# Patient Record
Sex: Male | Born: 1965 | Race: Black or African American | Hispanic: No | Smoking: Current every day smoker
Health system: Southern US, Community
[De-identification: ages and names within clinical notes are randomized; demographics above are authoritative.]

## PROBLEM LIST (undated history)

## (undated) DIAGNOSIS — F32A Depression, unspecified: Secondary | ICD-10-CM

## (undated) DIAGNOSIS — I10 Essential (primary) hypertension: Secondary | ICD-10-CM

## (undated) DIAGNOSIS — K219 Gastro-esophageal reflux disease without esophagitis: Secondary | ICD-10-CM

## (undated) DIAGNOSIS — G4730 Sleep apnea, unspecified: Secondary | ICD-10-CM

## (undated) DIAGNOSIS — J189 Pneumonia, unspecified organism: Secondary | ICD-10-CM

## (undated) DIAGNOSIS — F329 Major depressive disorder, single episode, unspecified: Secondary | ICD-10-CM

## (undated) DIAGNOSIS — F419 Anxiety disorder, unspecified: Secondary | ICD-10-CM

## (undated) DIAGNOSIS — M1990 Unspecified osteoarthritis, unspecified site: Secondary | ICD-10-CM

## (undated) HISTORY — DX: Depression, unspecified: F32.A

## (undated) HISTORY — PX: TONSILLECTOMY: SUR1361

## (undated) HISTORY — DX: Anxiety disorder, unspecified: F41.9

## (undated) HISTORY — DX: Major depressive disorder, single episode, unspecified: F32.9

---

## 2018-05-17 ENCOUNTER — Emergency Department (HOSPITAL_COMMUNITY)
Admission: EM | Admit: 2018-05-17 | Discharge: 2018-05-17 | Disposition: A | Discharge status: Home / Self Care | Payer: 59 | Attending: Emergency Medicine | Admitting: Emergency Medicine

## 2018-05-17 ENCOUNTER — Emergency Department (HOSPITAL_COMMUNITY): Payer: 59

## 2018-05-17 ENCOUNTER — Encounter (HOSPITAL_COMMUNITY): Payer: Self-pay

## 2018-05-17 ENCOUNTER — Other Ambulatory Visit: Payer: Self-pay

## 2018-05-17 DIAGNOSIS — I10 Essential (primary) hypertension: Secondary | ICD-10-CM | POA: Diagnosis not present

## 2018-05-17 DIAGNOSIS — F17200 Nicotine dependence, unspecified, uncomplicated: Secondary | ICD-10-CM | POA: Insufficient documentation

## 2018-05-17 DIAGNOSIS — M5441 Lumbago with sciatica, right side: Secondary | ICD-10-CM | POA: Insufficient documentation

## 2018-05-17 DIAGNOSIS — M545 Low back pain: Secondary | ICD-10-CM | POA: Diagnosis present

## 2018-05-17 HISTORY — DX: Essential (primary) hypertension: I10

## 2018-05-17 HISTORY — DX: Unspecified osteoarthritis, unspecified site: M19.90

## 2018-05-17 MED ORDER — METHOCARBAMOL 500 MG PO TABS: 500.0000 mg | ORAL_TABLET | Freq: Two times a day (BID) | ORAL | 0 refills | Status: DC

## 2018-05-17 MED ORDER — LIDOCAINE 5 % EX PTCH: 1.0000 | MEDICATED_PATCH | CUTANEOUS | 0 refills | Status: DC

## 2018-05-17 MED ORDER — PREDNISONE 10 MG PO TABS: 40.0000 mg | ORAL_TABLET | Freq: Every day | ORAL | 0 refills | Status: AC

## 2018-05-17 MED ORDER — NAPROXEN 500 MG PO TABS: 500.0000 mg | ORAL_TABLET | Freq: Two times a day (BID) | ORAL | 0 refills | Status: DC

## 2018-05-17 MED ORDER — MORPHINE SULFATE (PF) 4 MG/ML IV SOLN
4.0000 mg | Freq: Once | INTRAVENOUS | Status: AC
  Administered 2018-05-17: 4 mg via INTRAMUSCULAR
  Filled 2018-05-17: qty 1

## 2018-05-17 NOTE — ED Provider Notes (Signed)
[facility]-EMERGENCY DEPT Provider Note   CSN: [id number] Arrival date & time: 05/17/18  4098     History   Chief Complaint Chief Complaint  Patient presents with  . Hip Pain  . Leg Pain    HPI Donald Hess is a 52 y.o. male with a history of hypertension, arthritis, who presents emergency department today for back pain and right leg pain.  Patient reports that he has a history of 2 herniated disks in his lumbar spine.  He was seen by a specialist out of town and has had multiple episodes of sciatica in the past.  He states that he had a flareup of similar symptoms with right-sided low back pain that radiates into his right hip, down the back of his leg and into his right foot.  He reports associated tingling.  He reports that this is worse with ambulation and hip flexion.  He denies any weakness.  He was seen at urgent care yesterday and received a shot of Toradol that did not relieve his pain.  He notes he has been taking ibuprofen without relief.  He reports he has taken steroids for his symptoms in the past with relief of his symptoms. He is concerned because the pain in his hip is more than his usual flairs and is requesting a xray. He denies trauma or falls. Denies history of cancer, trauma, fever, night pain, IV drug use, recent spinal manipulation or procedures, upper back pain or neck pain, urinary retention, loss of bowel/bladder function, saddle anesthesia, or unexplained weight loss. No abdominal pain. Denies dysuria, flank pain, frequency, urgency, or hematuria.   HPI  Past Medical History:  Diagnosis Date  . Arthritis   . Hypertension     There are no active problems to display for this patient.   Past Surgical History:  Procedure Laterality Date  . TONSILLECTOMY         Home Medications    Prior to Admission medications   Not on File    Family History No family history on file.  Social History Social History   Tobacco Use  .  Smoking status: Current Every Day Smoker    Packs/day: 0.50    Years: 35.00    Pack years: 17.50  . Smokeless tobacco: Never Used  Substance Use Topics  . Alcohol use: Yes  . Drug use: Yes    Types: Cocaine, Marijuana    Comment: "if i'm at a party"     Allergies   Lisinopril   Review of Systems Review of Systems  All other systems reviewed and are negative.    Physical Exam Updated Vital Signs BP (!) 143/91 (BP Location: Left Arm)   Pulse 83   Temp 98 F (36.7 C) (Oral)   Resp 15   Ht 5\' 11"  (1.803 m)   Wt 117 kg   SpO2 95%   BMI 35.98 kg/m   Physical Exam  Constitutional: He appears well-developed and well-nourished. No distress.  Non-toxic appearing  HENT:  Head: Normocephalic and atraumatic.  Right Ear: External ear normal.  Left Ear: External ear normal.  Neck: Normal range of motion. Neck supple. No spinous process tenderness present. No neck rigidity. Normal range of motion present.  Cardiovascular: Normal rate, regular rhythm, normal heart sounds and intact distal pulses.  No murmur heard. Pulses:      Radial pulses are 2+ on the right side, and 2+ on the left side.       Femoral pulses  are 2+ on the right side, and 2+ on the left side.      Dorsalis pedis pulses are 2+ on the right side, and 2+ on the left side.       Posterior tibial pulses are 2+ on the right side, and 2+ on the left side.  Pulmonary/Chest: Effort normal and breath sounds normal. No respiratory distress.  Abdominal: Soft. Bowel sounds are normal. He exhibits no distension and no pulsatile midline mass. There is no tenderness. There is no rigidity, no rebound, no guarding and no CVA tenderness.  Musculoskeletal:       Right hip: Normal. He exhibits normal range of motion, normal strength and no tenderness.  Posterior and appearance appears normal. No evidence of obvious scoliosis or kyphosis. No obvious signs of skin changes, trauma, deformity, infection. No C, T, or L spine tenderness  or step-offs to palpation. No C, T paraspinal tenderness. Right lumbar paraspinal ttp. Right gluteal ttp. No bony ttp of the hip. Negative log roll test. No leg shortening or external rotation. Normal rom of the hip. Pain in back and leg with minimal hip flexion. Lung expansion normal. Bilateral lower extremity strength 5 out of 5 including extensor hallucis longus. Patellar and Achilles deep tendon reflex 2+ and equal bilaterally. Sensation of lower extremities grossly intact. Straight leg right postive. Straight leg left negative. Gait able but patient notes painful. Lower extremity compartments soft. PT and DP 2+ b/l. Cap refill <2 seconds.   Neurological: He is alert. He has normal strength. No sensory deficit.  No foot drop. Able gait  Skin: Skin is warm, dry and intact. Capillary refill takes less than 2 seconds. No rash noted. He is not diaphoretic. No erythema.  No vesicular-like rash noted.  Nursing note and vitals reviewed.    ED Treatments / Results  Labs (all labs ordered are listed, but only abnormal results are displayed) Labs Reviewed - No data to display  EKG None  Radiology Dg Hip Unilat With Pelvis 2-3 Views Right  Result Date: 05/17/2018 CLINICAL DATA:  Pain right hip and leg. EXAM: DG HIP (WITH OR WITHOUT PELVIS) 2-3V RIGHT COMPARISON:  No prior. FINDINGS: Degenerative changes lumbar spine and both hips. No acute bony or joint abnormality identified. No evidence of fracture dislocation. Pelvic calcifications consistent with phleboliths. IMPRESSION: Degenerative changes lumbar spine and both hips. No acute abnormality identified. Electronically Signed   By: Maisie Fushomas  Register   On: 05/17/2018 07:31    Procedures Procedures (including critical care time)  Medications Ordered in ED Medications  morphine 4 MG/ML injection 4 mg (has no administration in time range)     Initial Impression / Assessment and Plan / ED Course  I have reviewed the triage vital signs and the  nursing notes.  Pertinent labs & imaging results that were available during my care of the patient were reviewed by me and considered in my medical decision making (see chart for details).     52 y.o. year old male with back pain.  No neurologic deficits and normal neuro exam.  Patient can walk but states it is painful.  No bowel or bladder incontinence.  No urinary retention or saddle anesthesia.  No concern for cauda equina.  No history of trauma, personal history of cancer, night sweats or weight loss that would warrant a x-ray of the lumbar spine at this time.  No  fever or IV drug use that make me concerned for spinal abscess.  No pulsatile mass of the  abdomen.  No abdominal tenderness to palpation or guarding to make me concern for intra-abdominal pathology.  No urinary symptoms or CVA tenderness to make me concerned for cystitis versus pyelonephritis versus kidney stone. Patient with some right hip pina. Xrays unremarkable. Suspect patient's symptoms are musculoskeletal in nature.  evidence of sciatica with  straight leg raise.  Will treat the patient with back exercises, activity modification, muscle relaxers, NSAIDs (no history of kidney disease or GI bleed), and lidocaine patches. Will also give steroids for sciatica (no hx of dm reported).  Patient is to follow-up with PCP versus orthopedics (referral given).  Strict return precautions discussed.  Patient appears safe for discharge.  Final Clinical Impressions(s) / ED Diagnoses   Final diagnoses:  Acute right-sided low back pain with right-sided sciatica    ED Discharge Orders         Ordered    methocarbamol (ROBAXIN) 500 MG tablet  2 times daily     05/17/18 0819    naproxen (NAPROSYN) 500 MG tablet  2 times daily     05/17/18 0819    predniSONE (DELTASONE) 10 MG tablet  Daily with breakfast     05/17/18 0819    lidocaine (LIDODERM) 5 %  Every 24 hours     05/17/18 0819           Jacinto Halim, PA-C 05/17/18 0909      Molpus, Jonny Ruiz, MD 05/20/18 [phone]

## 2018-05-17 NOTE — Discharge Instructions (Addendum)
You were seen here today for Back Pain: Low back pain is discomfort in the lower back that may be due to injuries to muscles and ligaments around the spine. Occasionally, it may be caused by a problem to a part of the spine called a disc. Your back pain should be treated with medicines listed below as well as back exercises and this back pain should get better over the next 2 weeks. Most patients get completely well in 4 weeks. It is important to know however, if you develop severe or worsening pain, low back pain with fever, numbness, weakness or inability to walk or urinate, you should return to the ER immediately.  Please follow up with your doctor this week for a recheck if still having symptoms.  HOME INSTRUCTIONS Self - care:  The application of heat can help soothe the pain.  Maintaining your daily activities, including walking (this is encouraged), as it will help you get better faster than just staying in bed. Do not life, push, pull anything more than 10 pounds for the next week. I am attaching back exercises that you can do at home to help facilitate your recovery.   Back Exercises - I have attached a handout on back exercises that can be done at home to help facilitate your recovery.   Medications are also useful to help with pain control.   Acetaminophen.  This medication is generally safe, and found over the counter. Take as directed for your age. You should not take more than 8 of the extra strength (500mg ) pills a day (max dose is 4000mg  total OVER one day)  Non steroidal anti inflammatory: This includes medications including Ibuprofen, naproxen and Mobic; These medications help both pain and swelling and are very useful in treating back pain.  They should be taken with food, as they can cause stomach upset, and more seriously, stomach bleeding. Do not combine the medications.   Muscle relaxants:  These medications can help with muscle tightness that is a cause of lower back pain.  Most  of these medications can cause drowsiness, and it is not safe to drive or use dangerous machinery while taking them. They are primarily helpful when taken at night before sleep.  Prednisone - Prednisone can be used for low back pain when nerves such as the sciatic nerve are though to be involved. This medication will help decrease the inflammation around the nerve and help facilitate faster recovery. Call your pharmacist if you have any questions.  You will need to follow up with your primary healthcare provider or the Orthopedist in 1-2 weeks for reassessment and persistent symptoms.  Be aware that if you develop new symptoms, such as a fever, leg weakness, difficulty with or loss of control of your urine or bowels, abdominal pain, or more severe pain, you will need to seek medical attention and/or return to the Emergency department.  Additional Information:  Your xrays did not show evidence of broken bones or dislocations.   Your vital signs today were: BP (!) 143/91 (BP Location: Left Arm)    Pulse 83    Temp 98 F (36.7 C) (Oral)    Resp 15    Ht 5\' 11"  (1.803 m)    Wt 117 kg    SpO2 95%    BMI 35.98 kg/m  If your blood pressure (BP) was elevated above 135/85 this visit, please have this repeated by your doctor within one month. ---------------

## 2018-05-17 NOTE — ED Triage Notes (Signed)
Per ems: pt coming from home c/o pain in hips and bilateral legs that started two days ago. Went to Urgent Care yesterday for same thing, received pain medication and stated "pain is back"  Hx of two herniated spinal disk and HTN

## 2018-05-17 NOTE — ED Notes (Signed)
Bed: [id number] Expected date:  Expected time:  Means of arrival:  Comments: Hip and leg pain

## 2018-05-24 ENCOUNTER — Other Ambulatory Visit: Payer: Self-pay

## 2018-05-24 ENCOUNTER — Encounter (HOSPITAL_COMMUNITY): Payer: Self-pay | Admitting: *Deleted

## 2018-05-24 ENCOUNTER — Emergency Department (HOSPITAL_COMMUNITY)
Admission: EM | Admit: 2018-05-24 | Discharge: 2018-05-24 | Disposition: A | Discharge status: Home / Self Care | Payer: 59 | Attending: Emergency Medicine | Admitting: Emergency Medicine

## 2018-05-24 DIAGNOSIS — G8929 Other chronic pain: Secondary | ICD-10-CM | POA: Insufficient documentation

## 2018-05-24 DIAGNOSIS — I10 Essential (primary) hypertension: Secondary | ICD-10-CM | POA: Insufficient documentation

## 2018-05-24 DIAGNOSIS — Z79899 Other long term (current) drug therapy: Secondary | ICD-10-CM | POA: Insufficient documentation

## 2018-05-24 DIAGNOSIS — F17200 Nicotine dependence, unspecified, uncomplicated: Secondary | ICD-10-CM | POA: Diagnosis not present

## 2018-05-24 DIAGNOSIS — M5441 Lumbago with sciatica, right side: Secondary | ICD-10-CM | POA: Diagnosis not present

## 2018-05-24 MED ORDER — KETOROLAC TROMETHAMINE 30 MG/ML IJ SOLN
30.0000 mg | Freq: Once | INTRAMUSCULAR | Status: AC
  Administered 2018-05-24: 30 mg via INTRAMUSCULAR
  Filled 2018-05-24: qty 1

## 2018-05-24 MED ORDER — CYCLOBENZAPRINE HCL 10 MG PO TABS: 10.0000 mg | ORAL_TABLET | Freq: Two times a day (BID) | ORAL | 0 refills | Status: DC | PRN

## 2018-05-24 NOTE — ED Provider Notes (Signed)
[facility]   CSN: [id number] Arrival date & time: 05/24/18  0510     History   Chief Complaint Chief Complaint  Patient presents with  . Back Pain    HPI Donald Hess is a 52 y.o. male.  HPI  52 yo male with history of herniated disc with associated sciatica for several years.  Moved here from [place] 3 months ago.  Pain worsened during the same time frame.  Pain is severe.  Pain was moderate.  Leg is numb and tingling- has been present but seems worse than hip.  NO new injury.  No loss of bowel or bladder control.  Feels like something is gouging the bone out and is severe.  Not relieved by position.  Worsens with movement.  Patient treated with naprosyn, steroids, and smr without relief.   Past Medical History:  Diagnosis Date  . Arthritis   . Hypertension     There are no active problems to display for this patient.   Past Surgical History:  Procedure Laterality Date  . TONSILLECTOMY          Home Medications    Prior to Admission medications   Medication Sig Start Date End Date Taking? Authorizing Provider  amLODipine (NORVASC) 10 MG tablet Take 10 mg by mouth daily.   Yes [provider]  atorvastatin (LIPITOR) 40 MG tablet Take 40 mg by mouth at bedtime.   Yes [provider]  cloNIDine (CATAPRES) 0.1 MG tablet Take 0.1 mg by mouth 2 (two) times daily.   Yes [provider]  divalproex (DEPAKOTE) 500 MG DR tablet Take 500-1,000 mg by mouth 2 (two) times daily. 1 tablet every morning and 2 tablets every night at bedtime   Yes [provider]  hydrochlorothiazide (HYDRODIURIL) 25 MG tablet Take 25 mg by mouth daily.   Yes [provider]  meloxicam (MOBIC) 15 MG tablet Take 15 mg by mouth daily.   Yes [provider]  mirtazapine (REMERON) 15 MG tablet Take 15 mg by mouth at bedtime.   Yes [provider]  naproxen (NAPROSYN) 500 MG tablet Take 1  tablet (500 mg total) by mouth 2 (two) times daily. 05/17/18  Yes Maczis, Elmer SowMichael M, PA-C  omeprazole (PRILOSEC) 20 MG capsule Take 20 mg by mouth daily.   Yes [provider]  QUEtiapine (SEROQUEL) 100 MG tablet Take 100 mg by mouth at bedtime.   Yes [provider]  tamsulosin (FLOMAX) 0.4 MG CAPS capsule Take 0.4 mg by mouth daily.   Yes [provider]  traZODone (DESYREL) 50 MG tablet Take 25-50 mg by mouth See admin instructions. Take 1/2 tablet at 7pm and take 1 tablet at bedtime   Yes [provider]  lidocaine (LIDODERM) 5 % Place 1 patch onto the skin daily. Remove & Discard patch within 12 hours or as directed by MD 05/17/18   Maczis, Elmer SowMichael M, PA-C  methocarbamol (ROBAXIN) 500 MG tablet Take 1 tablet (500 mg total) by mouth 2 (two) times daily. Patient not taking: Reported on 05/24/2018 05/17/18   Jacinto HalimMaczis, Michael M, PA-C    Family History No family history on file.  Social History Social History   Tobacco Use  . Smoking status: Current Every Day Smoker    Packs/day: 0.50    Years: 35.00    Pack years: 17.50  . Smokeless tobacco: Never Used  Substance Use Topics  . Alcohol use: Yes  . Drug use: Yes  Types: Cocaine, Marijuana    Comment: "if i'm at a party"     Allergies   Lisinopril   Review of Systems Review of Systems  All other systems reviewed and are negative.    Physical Exam Updated Vital Signs BP (!) 141/98   Pulse 85   Temp 97.9 F (36.6 C) (Oral)   Resp 18   SpO2 95%   Physical Exam  Constitutional: He is oriented to person, place, and time. He appears well-developed and well-nourished.  HENT:  Head: Normocephalic and atraumatic.  Eyes: Pupils are equal, round, and reactive to light. EOM are normal.  Neck: Normal range of motion.  Cardiovascular: Normal rate and regular rhythm.  Pulmonary/Chest: Effort normal.  Abdominal: Soft.  Musculoskeletal: Normal range of motion. He exhibits tenderness. He exhibits  no edema.  Neurological: He is alert and oriented to person, place, and time. He displays normal reflexes. No cranial nerve deficit or sensory deficit. He exhibits normal muscle tone. Coordination normal.  Skin: Skin is warm and dry. Capillary refill takes less than 2 seconds.  Psychiatric: He has a normal mood and affect.  Nursing note and vitals reviewed.    ED Treatments / Results  Labs (all labs ordered are listed, but only abnormal results are displayed) Labs Reviewed - No data to display  EKG None  Radiology No results found.  Procedures Procedures (including critical care time)  Medications Ordered in ED Medications - No data to display   Initial Impression / Assessment and Plan / ED Course  I have reviewed the triage vital signs and the nursing notes.  Pertinent labs & imaging results that were available during my care of the patient were reviewed by me and considered in my medical decision making (see chart for details).     52 year old male history of chronic low back pain with sciatica presents today after having, reportedly, moved to this area from [place].  He has been seen here and treated with skeletal muscle relaxants, nonsteroidal anti-inflammatories, and prescribed lidocaine patch, which he does not appear to be filled.  He is states that the pain is continuing and worse in nature.  He had previous referrals and has scheduled visits in place.  Here he has no apparent change on exam without any acute neurological deficits.  Patient was given Toradol IM here.  He is advised to use ibuprofen and Flexeril as outpatient.  He is also advised to fill his previous lidocaine patch and follow-up with outpatient referrals as previously scheduled.  Final Clinical Impressions(s) / ED Diagnoses   Final diagnoses:  Chronic midline low back pain with right-sided sciatica    ED Discharge Orders    None       Margarita Grizzle, MD 05/24/18 [phone]

## 2018-05-24 NOTE — ED Notes (Signed)
Bed: [facility]PT1 Expected date:  Expected time:  Means of arrival:  Comments: 

## 2018-05-24 NOTE — Discharge Instructions (Signed)
Please fill previous lidocaine patch prescription.   Use ibuprofen Use flexeril as prescribed. Follow up with outpatient medical care as scheduled

## 2018-05-24 NOTE — ED Triage Notes (Signed)
Pt c/o pain in the right hip/right back and right leg. Pt was seen here for the same on the 23rd, has taken course of steroids, using pain meds and muscle relaxants without relief. He last took naproxyn about 1 hour ago. Says he started having some numbness and tingling in the right leg after he started taking the medication. No bowel/bladder incontinence. Has appt with orthopedist on the 10th.

## 2018-05-28 ENCOUNTER — Ambulatory Visit (HOSPITAL_COMMUNITY): Payer: Self-pay | Admitting: Psychiatry

## 2018-06-04 ENCOUNTER — Encounter (INDEPENDENT_AMBULATORY_CARE_PROVIDER_SITE_OTHER): Payer: Self-pay | Admitting: Orthopaedic Surgery

## 2018-06-04 ENCOUNTER — Ambulatory Visit (INDEPENDENT_AMBULATORY_CARE_PROVIDER_SITE_OTHER): Payer: 59

## 2018-06-04 ENCOUNTER — Ambulatory Visit (INDEPENDENT_AMBULATORY_CARE_PROVIDER_SITE_OTHER): Payer: 59 | Admitting: Orthopaedic Surgery

## 2018-06-04 ENCOUNTER — Other Ambulatory Visit (INDEPENDENT_AMBULATORY_CARE_PROVIDER_SITE_OTHER): Payer: Self-pay

## 2018-06-04 DIAGNOSIS — G8929 Other chronic pain: Secondary | ICD-10-CM | POA: Diagnosis not present

## 2018-06-04 DIAGNOSIS — M5442 Lumbago with sciatica, left side: Secondary | ICD-10-CM | POA: Diagnosis not present

## 2018-06-04 DIAGNOSIS — M4807 Spinal stenosis, lumbosacral region: Secondary | ICD-10-CM

## 2018-06-04 MED ORDER — TRAMADOL HCL 50 MG PO TABS: 50.0000 mg | ORAL_TABLET | Freq: Two times a day (BID) | ORAL | 0 refills | Status: DC | PRN

## 2018-06-04 MED ORDER — TIZANIDINE HCL 4 MG PO TABS: 4.0000 mg | ORAL_TABLET | Freq: Three times a day (TID) | ORAL | 0 refills | Status: DC | PRN

## 2018-06-04 NOTE — Progress Notes (Signed)
Office Visit Note   Patient: Donald Hess           Date of Birth: 05/03/1966           MRN: [id number] Visit Date: 06/04/2018              Requested by: No referring provider defined for this encounter. PCP: Patient, No Pcp Per   Assessment & Plan: Visit Diagnoses:  1. Chronic left-sided low back pain with left-sided sciatica     Plan: At this point given his reported history of lumbar spine issues with a herniated disc and in fact that he is been to the emergency room is now here twice due to severe pain, and MRI is warranted of his lumbar spine to rule out herniated disc so we can get a better idea of what is the driving source of his pain so then we can make appropriate treatment recommendations.  For now we will only do a short course of Zanaflex and tramadol but only a short course.  We will see him back once we have an MRI of his lumbar spine.  Follow-Up Instructions: Return in about 2 weeks (around 06/18/2018).   Orders:  Orders Placed This Encounter  Procedures  . XR Lumbar Spine 2-3 Views   Meds ordered this encounter  Medications  . tiZANidine (ZANAFLEX) 4 MG tablet    Sig: Take 1 tablet (4 mg total) by mouth every 8 (eight) hours as needed for muscle spasms.    Dispense:  40 tablet    Refill:  0  . traMADol (ULTRAM) 50 MG tablet    Sig: Take 1-2 tablets (50-100 mg total) by mouth every 12 (twelve) hours as needed.    Dispense:  20 tablet    Refill:  0      Procedures: No procedures performed   Clinical Data: No additional findings.   Subjective: Chief Complaint  Patient presents with  . Lower Back - Pain  The patient somewhat I am seeing for the first time as a referral from the emergency room.  He is someone from [place] who was recently moved down [place], last several months.  He is been to the emergency room twice now with severe back pain and sciatica.  He states that in [place] he was told he needed surgery due to herniated disc.  He reports  multiple epidural steroid injections when he was in [place].  He says he is unable to get his records and not sure how to do that.  He is down here now in [place] and having problems sleeping due to worsening pain and numbness and tingling in his feet.  Sometimes the left side is worse sometimes the right side is worse.  Denies any groin pain.  He is not a diabetic.  He is a smoker.  Again his biggest problem he states is ambulating and not getting any sleep at night now due to the severity of his pain.  He denies any change in bowel bladder function.  HPI  Review of Systems He currently denies any headache, chest pain, shortness of breath, fever, chills, nausea, vomiting.  Objective: Vital Signs: There were no vitals taken for this visit.  Physical Exam He is alert and oriented x3 and in no acute distress.  Is ambulate very slowly. Ortho Exam Examination of both his lower extremities shows decreased sensation more on the right than the left.  He seems to exhibit weakness with plantar flexion on the right foot  compared to the left but some of this lack of effort ability.  He seems to have a positive straight leg raise bilaterally and significant amount of pain out of portion of exam. Specialty Comments:  No specialty comments available.  Imaging: Xr Lumbar Spine 2-3 Views  Result Date: 06/04/2018 2 views lumbar spine show slight degenerative changes in the lower lumbar spine but no loss in alignment or acute findings.  There is no evidence of fracture.    PMFS History: There are no active problems to display for this patient.  Past Medical History:  Diagnosis Date  . Arthritis   . Hypertension     History reviewed. No pertinent family history.  Past Surgical History:  Procedure Laterality Date  . TONSILLECTOMY     Social History   Occupational History  . Not on file  Tobacco Use  . Smoking status: Current Every Day Smoker    Packs/day: 0.50    Years: 35.00    Pack  years: 17.50  . Smokeless tobacco: Never Used  Substance and Sexual Activity  . Alcohol use: Yes  . Drug use: Yes    Types: Cocaine, Marijuana    Comment: "if i'm at a party"  . Sexual activity: Not on file

## 2018-06-13 ENCOUNTER — Ambulatory Visit
Admission: RE | Admit: 2018-06-13 | Discharge: 2018-06-13 | Disposition: A | Discharge status: Home / Self Care | Payer: 59 | Source: Ambulatory Visit | Attending: Orthopaedic Surgery | Admitting: Orthopaedic Surgery

## 2018-06-13 DIAGNOSIS — M4807 Spinal stenosis, lumbosacral region: Secondary | ICD-10-CM

## 2018-06-18 ENCOUNTER — Ambulatory Visit (INDEPENDENT_AMBULATORY_CARE_PROVIDER_SITE_OTHER): Payer: 59 | Admitting: Orthopaedic Surgery

## 2018-06-18 ENCOUNTER — Encounter (INDEPENDENT_AMBULATORY_CARE_PROVIDER_SITE_OTHER): Payer: Self-pay | Admitting: Orthopaedic Surgery

## 2018-06-18 DIAGNOSIS — M4807 Spinal stenosis, lumbosacral region: Secondary | ICD-10-CM

## 2018-06-18 DIAGNOSIS — M5126 Other intervertebral disc displacement, lumbar region: Secondary | ICD-10-CM | POA: Diagnosis not present

## 2018-06-18 DIAGNOSIS — M5431 Sciatica, right side: Secondary | ICD-10-CM | POA: Diagnosis not present

## 2018-06-18 MED ORDER — HYDROCODONE-ACETAMINOPHEN 5-325 MG PO TABS: 1.0000 | ORAL_TABLET | Freq: Four times a day (QID) | ORAL | 0 refills | Status: DC | PRN

## 2018-06-18 NOTE — Progress Notes (Signed)
The patient is coming in today to go over an MRI of his lumbar spine.  He said right-sided sciatic and radicular symptoms with pain going all the way down his foot as well as weakness on that side.  He says positive straight leg raise as well.  There is numbness and tingling into his foot.  [place] had recently [place] state.  He reports that he has had numerous interventions in his spine in terms of epidural steroid injections but no surgery.  He said he was told that he would likely at some point need surgery on his back due to herniated disc.  I sent her for an MRI because we had to at least get a new MRI to get an idea of what is going on the spine.  He still having the same radicular symptoms on exam today.  MRI is reviewed with him and it does show a right-sided posterior lateral disc herniation at L4-L5 with a large disc fragment behind L4 nerve root and this is most likely compressing the nerve root and correlating with his symptoms on exam.  Given the extent of his disc herniation at L4-L5 to the right and how this is correlating with symptoms and given the fact that he is already failed conservative treatment with epidural steroid injections in time, we need to send him to 1 of our spine specialist to consider surgical intervention.  He is interested in talking with the spine specialist so we will set him up to see Dr. Ophelia CharterYates soon.  I will send in a short course of hydrocodone in the interim.

## 2018-06-21 ENCOUNTER — Encounter (INDEPENDENT_AMBULATORY_CARE_PROVIDER_SITE_OTHER): Payer: Self-pay | Admitting: Orthopaedic Surgery

## 2018-06-21 ENCOUNTER — Ambulatory Visit (INDEPENDENT_AMBULATORY_CARE_PROVIDER_SITE_OTHER): Payer: 59 | Admitting: Orthopaedic Surgery

## 2018-06-21 ENCOUNTER — Telehealth: Payer: Self-pay | Admitting: *Deleted

## 2018-06-21 VITALS — BP 144/92 | HR 110 | Ht 72.0 in | Wt 255.0 lb

## 2018-06-21 DIAGNOSIS — M5126 Other intervertebral disc displacement, lumbar region: Secondary | ICD-10-CM | POA: Diagnosis not present

## 2018-06-21 DIAGNOSIS — M5431 Sciatica, right side: Secondary | ICD-10-CM

## 2018-06-21 NOTE — Progress Notes (Signed)
Office Visit Note   Patient: Donald Hess           Date of Birth: 18-Jan-1966           MRN: [id number] Visit Date: 06/21/2018              Requested by: No referring provider defined for this encounter. PCP: Patient, No Pcp Per   Assessment & Plan: Visit Diagnoses:  1. Lumbar herniated disc   2. Sciatica, right side     Plan: With patient's progressively worsening symptoms along with right leg weakness and findings on MRI best course of treatment at this point would be right L4-5 microdiscectomy.  Surgery procedure along with potential risks and comp occasions discussed with patient in great detail by myself and Dr. Ophelia Charter.  All questions answered.  He states that he has some courses upcoming the next couple weekends but would like to have surgery the week of October 8.  I reviewed patient's medication and past medical history.  He states that he has seen a cardiologist a couple years ago while in [place] and had a stress test and at that time was advised that he had changes consistent with an MI.  We need to get him established with a primary care physician and also evaluated by cardiology before his surgery.  We will assist with this.  All questions answered.  Follow-Up Instructions: Return for need return office visit 1 week postop.   Orders:  No orders of the defined types were placed in this encounter.  No orders of the defined types were placed in this encounter.     Procedures: No procedures performed   Clinical Data: No additional findings.   Subjective: Chief Complaint  Patient presents with  . Lower Back - Pain    HPI 52 year old black male is referred by Dr. Doneen Hess for worsening low back pain and right lower extremity radiculopathy.  Patient states that he has had problems with his low back for several years but this is been progressively getting worse over the last 3 months.  No new injury.  States that he has pain in the low back that  radiates down to the right buttock and down to his right foot.  Also complains of right leg weakness and numbness and tingling.  No symptoms on the left side.  He has been seen in the emergency room and treated with injections and muscle relaxers.  Dr. Magnus Hess ordered a lumbar MRI and that showed L4-5 disc degeneration with right posterior lateral disc herniation with a 1 cm disc fragment migrated upward in the right lateral recess behind L4.  This would likely compress the right L4 nerve.  There is a bilateral facet also arthritis at this level which could contribute to back pain.  L5-S1 normal appearance of the disc.  Facet osteoarthritis right worse than left that could cause low back pain.  Last of years in [place] he has had conservative management with lumbar ESI's that did not give any relief.  He denies any complaints of bowel or bladder incontinence.  Patient just recently moved here several months ago and is not established with a primary care physician.  He states that he has seen a cardiologist while living in [place] a couple years ago and had a stress test and was told that he had changes consistent with a possible MI.  We do not have any of those records were reviewed. Review of Systems No current cardiac pulmonary GI GU  issues  Objective: Vital Signs: BP (!) 144/92   Pulse (!) 110   Ht 6' (1.829 m)   Wt 255 lb (115.7 kg)   BMI 34.58 kg/m   Physical Exam  Constitutional: He is oriented to person, place, and time. No distress.  HENT:  Head: Normocephalic and atraumatic.  Eyes: Pupils are equal, round, and reactive to light. EOM are normal.  Neck: Normal range of motion.  Cardiovascular: Normal rate.  Pulmonary/Chest: Breath sounds normal. No respiratory distress.  Abdominal: He exhibits no distension.  Musculoskeletal:  Gait antalgic.  Lumbar paraspinal tenderness.  Negative log about his.  Positive right straight leg raise.  Bilateral calves nontender.  Trace right anterior  tib and gastroc weakness.  Neurological: He is alert and oriented to person, place, and time.  Skin: Skin is warm and dry.  Psychiatric: He has a normal mood and affect.    Ortho Exam  Specialty Comments:  No specialty comments available.  Imaging: No results found.   PMFS History: Patient Active Problem List   Diagnosis Date Noted  . Sciatica, right side 06/18/2018  . Lumbar herniated disc 06/18/2018   Past Medical History:  Diagnosis Date  . Arthritis   . Hypertension     History reviewed. No pertinent family history.  Past Surgical History:  Procedure Laterality Date  . TONSILLECTOMY     Social History   Occupational History  . Not on file  Tobacco Use  . Smoking status: Current Every Day Smoker    Packs/day: 0.50    Years: 35.00    Pack years: 17.50  . Smokeless tobacco: Never Used  Substance and Sexual Activity  . Alcohol use: Yes  . Drug use: Yes    Types: Cocaine, Marijuana    Comment: "if i'm at a party"  . Sexual activity: Not on file

## 2018-06-21 NOTE — Telephone Encounter (Signed)
   [facility] Pre-operative Risk Assessment    Request for surgical clearance:  1. What type of surgery is being performed? L 4-5 Microdiscectomy   2. When is this surgery scheduled? TBD pending clearance  3. What type of clearance is required (medical clearance vs. Pharmacy clearance to hold med vs. Both)? BOTH  4. Are there any medications that need to be held prior to surgery and how long? Pending Provider Advisory  5. Practice name and name of physician performing surgery? [facility], Rodell Perna, MD  6. What is your office phone number [phone]   7.   What is your office fax number [phone]   8.   Anesthesia type (None, local, MAC, general)? TBD pending clearance   Donald Hess 06/21/2018, 4:26 PM  _________________________________________________________________   (provider comments below)

## 2018-06-24 ENCOUNTER — Telehealth (HOSPITAL_COMMUNITY): Payer: Self-pay

## 2018-06-24 ENCOUNTER — Ambulatory Visit (INDEPENDENT_AMBULATORY_CARE_PROVIDER_SITE_OTHER): Payer: 59 | Admitting: Cardiology

## 2018-06-24 ENCOUNTER — Encounter: Payer: Self-pay | Admitting: Cardiology

## 2018-06-24 VITALS — BP 110/72 | HR 84 | Ht 72.0 in | Wt 257.0 lb

## 2018-06-24 DIAGNOSIS — F17210 Nicotine dependence, cigarettes, uncomplicated: Secondary | ICD-10-CM

## 2018-06-24 DIAGNOSIS — E782 Mixed hyperlipidemia: Secondary | ICD-10-CM

## 2018-06-24 DIAGNOSIS — I10 Essential (primary) hypertension: Secondary | ICD-10-CM | POA: Diagnosis not present

## 2018-06-24 DIAGNOSIS — Z01810 Encounter for preprocedural cardiovascular examination: Secondary | ICD-10-CM | POA: Insufficient documentation

## 2018-06-24 NOTE — Telephone Encounter (Signed)
The patient was seen by Revankar, Aundra Dubin, MD today for surgical clearance. Pending testing. Will provide clearance once test resulted.   Manson Passey, PAC

## 2018-06-24 NOTE — Patient Instructions (Signed)
Medication Instructions:  Your physician recommends that you continue on your current medications as directed. Please refer to the Current Medication list given to you today.  Labwork: Your physician recommends that you have the following labs drawn: BMP and magnesium  Testing/Procedures: Your physician has requested that you have an echocardiogram. Echocardiography is a painless test that uses sound waves to create images of your heart. It provides your doctor with information about the size and shape of your heart and how well your heart's chambers and valves are working. This procedure takes approximately one hour. There are no restrictions for this procedure.  Your physician has requested that you have a lexiscan myoview. For further information please visit [email or web address] Please follow instruction sheet, as given.  Follow-Up: Your physician recommends that you schedule a follow-up appointment in: as needed  Any Other Special Instructions Will Be Listed Below (If Applicable).     If you need a refill on your cardiac medications before your next appointment, please call your pharmacy.   [facility]  Garey Ham, RN, BSN

## 2018-06-24 NOTE — Progress Notes (Deleted)
Cardiology Office Note:    Date:  06/24/2018   ID:  Donald Hess, DOB 09/10/1966, MRN [id number]  PCP:  Patient, No Pcp Per  Cardiologist:  Garwin Brothers, MD   Referring MD: Eldred Manges, MD    ASSESSMENT:    1. Pre-operative cardiovascular examination   2. Essential hypertension   3. Mixed dyslipidemia   4. Cigarette smoker    PLAN:    In order of problems listed above:  1. ***   Medication Adjustments/Labs and Tests Ordered: Current medicines are reviewed at length with the patient today.  Concerns regarding medicines are outlined above.  No orders of the defined types were placed in this encounter.  No orders of the defined types were placed in this encounter.    History of Present Illness:    Donald Hess is a 52 y.o. male who is being seen today for the evaluation of *** at the request of Eldred Manges, MD. ***  Past Medical History:  Diagnosis Date  . Arthritis   . Hypertension     Past Surgical History:  Procedure Laterality Date  . TONSILLECTOMY      Current Medications: Current Meds  Medication Sig  . amLODipine (NORVASC) 10 MG tablet Take 10 mg by mouth daily.  . cloNIDine (CATAPRES) 0.1 MG tablet Take 0.1 mg by mouth 2 (two) times daily.  . cyclobenzaprine (FLEXERIL) 10 MG tablet Take 1 tablet (10 mg total) by mouth 2 (two) times daily as needed for muscle spasms.  . divalproex (DEPAKOTE) 500 MG DR tablet Take 500-1,000 mg by mouth 2 (two) times daily. 1 tablet every morning and 2 tablets every night at bedtime  . hydrochlorothiazide (HYDRODIURIL) 25 MG tablet Take 25 mg by mouth daily.  Marland Kitchen HYDROcodone-acetaminophen (NORCO/VICODIN) 5-325 MG tablet Take 1-2 tablets by mouth every 6 (six) hours as needed for moderate pain.  . mirtazapine (REMERON) 15 MG tablet Take 15 mg by mouth at bedtime.  Marland Kitchen QUEtiapine (SEROQUEL) 100 MG tablet Take 100 mg by mouth at bedtime.  . traZODone (DESYREL) 50 MG tablet Take 25-50 mg by mouth See admin  instructions. Take 1/2 tablet at 7pm and take 1 tablet at bedtime     Allergies:   Lisinopril   Social History   Socioeconomic History  . Marital status: Unknown    Spouse name: Not on file  . Number of children: Not on file  . Years of education: Not on file  . Highest education level: Not on file  Occupational History  . Not on file  Social Needs  . Financial resource strain: Not on file  . Food insecurity:    Worry: Not on file    Inability: Not on file  . Transportation needs:    Medical: Not on file    Non-medical: Not on file  Tobacco Use  . Smoking status: Current Every Day Smoker    Packs/day: 0.50    Years: 35.00    Pack years: 17.50  . Smokeless tobacco: Never Used  Substance and Sexual Activity  . Alcohol use: Yes  . Drug use: Yes    Types: Cocaine, Marijuana    Comment: "if i'm at a party"  . Sexual activity: Not on file  Lifestyle  . Physical activity:    Days per week: Not on file    Minutes per session: Not on file  . Stress: Not on file  Relationships  . Social connections:    Talks on phone: Not on file  Gets together: Not on file    Attends religious service: Not on file    Active member of club or organization: Not on file    Attends meetings of clubs or organizations: Not on file    Relationship status: Not on file  Other Topics Concern  . Not on file  Social History Narrative  . Not on file     Family History: The patient's family history is not on file.  ROS:   Please see the history of present illness.    All other systems reviewed and are negative.  EKGs/Labs/Other Studies Reviewed:    The following studies were reviewed today: ***   Recent Labs: No results found for requested labs within last 8760 hours.  Recent Lipid Panel No results found for: CHOL, TRIG, HDL, CHOLHDL, VLDL, LDLCALC, LDLDIRECT  Physical Exam:    VS:  BP 110/72 (BP Location: Right Arm, Patient Position: Sitting, Cuff Size: Normal)   Pulse 84   Ht  6' (1.829 m)   Wt 257 lb (116.6 kg)   SpO2 97%   BMI 34.86 kg/m     Wt Readings from Last 3 Encounters:  06/24/18 257 lb (116.6 kg)  06/21/18 255 lb (115.7 kg)  05/17/18 258 lb (117 kg)     GEN: Patient is in no acute distress HEENT: Normal NECK: No JVD; No carotid bruits LYMPHATICS: No lymphadenopathy CARDIAC: S1 S2 regular, 2/6 systolic murmur at the apex. RESPIRATORY:  Clear to auscultation without rales, wheezing or rhonchi  ABDOMEN: Soft, non-tender, non-distended MUSCULOSKELETAL:  No edema; No deformity  SKIN: Warm and dry NEUROLOGIC:  Alert and oriented x 3 PSYCHIATRIC:  Normal affect    Signed, Garwin Brothers, MD  06/24/2018 9:16 AM    [facility]

## 2018-06-24 NOTE — Telephone Encounter (Signed)
Attempted to contact Donald Hess to give him his instructions for his Nuclear Stress Test on 07/04/2018 and a gentleman answered and stated we had the wrong number. Will try and find another contact. S.Wiliams EMTP

## 2018-06-24 NOTE — Telephone Encounter (Signed)
Spoke with the pt about his stress test. He stated that he understood and would be here. S.Dania Marsan EMTP

## 2018-06-24 NOTE — Progress Notes (Signed)
Cardiology Office Note:    Date:  06/24/2018   ID:  Donald Hess, DOB 05-24-1966, MRN [id number]  PCP:  Patient, No Pcp Per  Cardiologist:  Garwin Brothers, MD   Referring MD: Eldred Manges, MD    ASSESSMENT:    1. Pre-operative cardiovascular examination   2. Essential hypertension   3. Mixed dyslipidemia   4. Cigarette smoker    PLAN:    In order of problems listed above:  1. Primary prevention stressed with the patient.  Importance of compliance with diet and medication stressed and he vocalized understanding.  His blood pressure is stable. 2. I spent 5 minutes with the patient discussing solely about smoking. Smoking cessation was counseled. I suggested to the patient also different medications and pharmacological interventions. Patient is keen to try stopping on its own at this time. He will get back to me if he needs any further assistance in this matter. 3. Because of EKG abnormality he will have a Chem-7 and magnesium level. 4. Echocardiogram will be done to assess murmur heard on auscultation. 5. In view of his multiple risk factors and sedentary lifestyle he will undergo Lexiscan sestamibi.  If this is negative then he is not at high risk for coronary events during the aforementioned surgery.  Close hemodynamic monitoring will further reduce the risk of coronary events.  His lipids are followed by his primary care physician.  He was be seen in follow-up appointment on a as needed basis only.   Medication Adjustments/Labs and Tests Ordered: Current medicines are reviewed at length with the patient today.  Concerns regarding medicines are outlined above.  No orders of the defined types were placed in this encounter.  No orders of the defined types were placed in this encounter.    History of Present Illness:    Donald Hess is a 52 y.o. male who is being seen today for the evaluation of preop cardiovascular assessment at the request of Eldred Manges, MD.   Patient is a pleasant 52 year old male.  He has past medical history of essential hypertension, dyslipidemia and active heavy smoking from a long time.  He mentions to me in the past he was told of have having a heart attack.  He is here for preop assessment for spinal stenosis surgery.  He denies any chest pain orthopnea or PND but leads a sedentary lifestyle.  At the time of my evaluation, the patient is alert awake oriented and in no distress.  Past Medical History:  Diagnosis Date  . Arthritis   . Hypertension     Past Surgical History:  Procedure Laterality Date  . TONSILLECTOMY      Current Medications: Current Meds  Medication Sig  . amLODipine (NORVASC) 10 MG tablet Take 10 mg by mouth daily.  . cloNIDine (CATAPRES) 0.1 MG tablet Take 0.1 mg by mouth 2 (two) times daily.  . cyclobenzaprine (FLEXERIL) 10 MG tablet Take 1 tablet (10 mg total) by mouth 2 (two) times daily as needed for muscle spasms.  . divalproex (DEPAKOTE) 500 MG DR tablet Take 500-1,000 mg by mouth 2 (two) times daily. 1 tablet every morning and 2 tablets every night at bedtime  . hydrochlorothiazide (HYDRODIURIL) 25 MG tablet Take 25 mg by mouth daily.  Marland Kitchen HYDROcodone-acetaminophen (NORCO/VICODIN) 5-325 MG tablet Take 1-2 tablets by mouth every 6 (six) hours as needed for moderate pain.  . mirtazapine (REMERON) 15 MG tablet Take 15 mg by mouth at bedtime.  Marland Kitchen QUEtiapine (SEROQUEL) 100  MG tablet Take 100 mg by mouth at bedtime.  . traZODone (DESYREL) 50 MG tablet Take 25-50 mg by mouth See admin instructions. Take 1/2 tablet at 7pm and take 1 tablet at bedtime     Allergies:   Lisinopril   Social History   Socioeconomic History  . Marital status: Unknown    Spouse name: Not on file  . Number of children: Not on file  . Years of education: Not on file  . Highest education level: Not on file  Occupational History  . Not on file  Social Needs  . Financial resource strain: Not on file  . Food insecurity:     Worry: Not on file    Inability: Not on file  . Transportation needs:    Medical: Not on file    Non-medical: Not on file  Tobacco Use  . Smoking status: Current Every Day Smoker    Packs/day: 0.50    Years: 35.00    Pack years: 17.50  . Smokeless tobacco: Never Used  Substance and Sexual Activity  . Alcohol use: Yes  . Drug use: Yes    Types: Cocaine, Marijuana    Comment: "if i'm at a party"  . Sexual activity: Not on file  Lifestyle  . Physical activity:    Days per week: Not on file    Minutes per session: Not on file  . Stress: Not on file  Relationships  . Social connections:    Talks on phone: Not on file    Gets together: Not on file    Attends religious service: Not on file    Active member of club or organization: Not on file    Attends meetings of clubs or organizations: Not on file    Relationship status: Not on file  Other Topics Concern  . Not on file  Social History Narrative  . Not on file     Family History: The patient's family history is not on file.  ROS:   Please see the history of present illness.    All other systems reviewed and are negative.  EKGs/Labs/Other Studies Reviewed:    The following studies were reviewed today: I discussed my findings with the patient.  EKG reveals sinus rhythm and nonspecific ST-T changes   Recent Labs: No results found for requested labs within last 8760 hours.  Recent Lipid Panel No results found for: CHOL, TRIG, HDL, CHOLHDL, VLDL, LDLCALC, LDLDIRECT  Physical Exam:    VS:  BP 110/72 (BP Location: Right Arm, Patient Position: Sitting, Cuff Size: Normal)   Pulse 84   Ht 6' (1.829 m)   Wt 257 lb (116.6 kg)   SpO2 97%   BMI 34.86 kg/m     Wt Readings from Last 3 Encounters:  06/24/18 257 lb (116.6 kg)  06/21/18 255 lb (115.7 kg)  05/17/18 258 lb (117 kg)     GEN: Patient is in no acute distress HEENT: Normal NECK: No JVD; No carotid bruits LYMPHATICS: No lymphadenopathy CARDIAC: S1 S2  regular, 2/6 systolic murmur at the apex. RESPIRATORY:  Clear to auscultation without rales, wheezing or rhonchi  ABDOMEN: Soft, non-tender, non-distended MUSCULOSKELETAL:  No edema; No deformity  SKIN: Warm and dry NEUROLOGIC:  Alert and oriented x 3 PSYCHIATRIC:  Normal affect    Signed, Garwin Brothers, MD  06/24/2018 9:20 AM    [facility]

## 2018-06-25 LAB — MAGNESIUM: Magnesium: 2.1 mg/dL (ref 1.6–2.3)

## 2018-06-25 LAB — BASIC METABOLIC PANEL
BUN/Creatinine Ratio: 12 (ref 9–20)
BUN: 12 mg/dL (ref 6–24)
CALCIUM: 9.7 mg/dL (ref 8.7–10.2)
CO2: 26 mmol/L (ref 20–29)
CREATININE: 1.04 mg/dL (ref 0.76–1.27)
Chloride: 99 mmol/L (ref 96–106)
GFR calc Af Amer: 96 mL/min/{1.73_m2} (ref >59)
GFR, EST NON AFRICAN AMERICAN: 83 mL/min/{1.73_m2} (ref >59)
Glucose: 96 mg/dL (ref 65–99)
Potassium: 4.2 mmol/L (ref 3.5–5.2)
Sodium: 141 mmol/L (ref 134–144)

## 2018-07-03 ENCOUNTER — Telehealth (INDEPENDENT_AMBULATORY_CARE_PROVIDER_SITE_OTHER): Payer: Self-pay

## 2018-07-03 MED ORDER — HYDROCODONE-ACETAMINOPHEN 5-325 MG PO TABS: 1.0000 | ORAL_TABLET | Freq: Two times a day (BID) | ORAL | 0 refills | Status: DC | PRN

## 2018-07-03 NOTE — Telephone Encounter (Signed)
Patient would like a Rx refill on Hydrocodone.  Patient stated that he is in a lot of pain.  Cb# is [phone].  Please advise.  Thank you.

## 2018-07-03 NOTE — Addendum Note (Signed)
Addended by: Rogers Seeds on: 07/03/2018 11:50 AM   Modules accepted: Orders

## 2018-07-03 NOTE — Telephone Encounter (Signed)
Please advise. Patient is going through pre-op clearances for surgery scheduling.

## 2018-07-03 NOTE — Telephone Encounter (Signed)
Ok for # 15 thanks

## 2018-07-03 NOTE — Telephone Encounter (Signed)
Script entered. I called patient and advised ready at front for pick up.

## 2018-07-04 ENCOUNTER — Ambulatory Visit (HOSPITAL_COMMUNITY): Payer: 59 | Attending: Cardiovascular Disease

## 2018-07-04 ENCOUNTER — Ambulatory Visit (HOSPITAL_BASED_OUTPATIENT_CLINIC_OR_DEPARTMENT_OTHER): Payer: 59

## 2018-07-04 VITALS — Ht 72.0 in | Wt 257.0 lb

## 2018-07-04 DIAGNOSIS — Z01810 Encounter for preprocedural cardiovascular examination: Secondary | ICD-10-CM

## 2018-07-04 DIAGNOSIS — I10 Essential (primary) hypertension: Secondary | ICD-10-CM

## 2018-07-04 DIAGNOSIS — E782 Mixed hyperlipidemia: Secondary | ICD-10-CM | POA: Insufficient documentation

## 2018-07-04 LAB — MYOCARDIAL PERFUSION IMAGING
CHL CUP NUCLEAR SDS: 1
CHL CUP NUCLEAR SRS: 0
LV dias vol: 157 mL (ref 62–150)
LVSYSVOL: 82 mL
Peak HR: 83 {beats}/min
Rest HR: 774 {beats}/min
SSS: 1
TID: 1.01

## 2018-07-04 LAB — ECHOCARDIOGRAM COMPLETE
Height: 72 in
WEIGHTICAEL: 4112 [oz_av]

## 2018-07-04 MED ORDER — REGADENOSON 0.4 MG/5ML IV SOLN
0.4000 mg | Freq: Once | INTRAVENOUS | Status: AC
  Administered 2018-07-04: 0.4 mg via INTRAVENOUS

## 2018-07-04 MED ORDER — TECHNETIUM TC 99M TETROFOSMIN IV KIT
10.6000 | PACK | Freq: Once | INTRAVENOUS | Status: AC | PRN
  Administered 2018-07-04: 10.6 via INTRAVENOUS
  Filled 2018-07-04: qty 11

## 2018-07-04 MED ORDER — TECHNETIUM TC 99M TETROFOSMIN IV KIT
31.3000 | PACK | Freq: Once | INTRAVENOUS | Status: AC | PRN
  Administered 2018-07-04: 31.3 via INTRAVENOUS
  Filled 2018-07-04: qty 32

## 2018-07-05 ENCOUNTER — Telehealth: Payer: Self-pay

## 2018-07-05 NOTE — Telephone Encounter (Signed)
-----   Message from Rajan R Revankar, MD sent at 07/05/2018  9:17 AM EDT ----- The results of the study is unremarkable. Please inform patient. I will discuss in detail at next appointment. Cc  primary care/referring physician Rajan R Revankar, MD 07/05/2018 9:17 AM 

## 2018-07-05 NOTE — Telephone Encounter (Signed)
Patient was called and notified of test results. 

## 2018-07-15 ENCOUNTER — Telehealth (INDEPENDENT_AMBULATORY_CARE_PROVIDER_SITE_OTHER): Payer: Self-pay | Admitting: Radiology

## 2018-07-15 ENCOUNTER — Ambulatory Visit: Payer: Self-pay

## 2018-07-15 MED ORDER — TRAMADOL HCL 50 MG PO TABS: 50.0000 mg | ORAL_TABLET | Freq: Two times a day (BID) | ORAL | 0 refills | Status: DC | PRN

## 2018-07-15 NOTE — Telephone Encounter (Signed)
Please advise on medication

## 2018-07-15 NOTE — Telephone Encounter (Signed)
Ok refill tramadol. Total  Joint was cancelled for Friday AM due to Thyroid, see if Eunice Blase can put him in for Friday or else next week thanks

## 2018-07-15 NOTE — Telephone Encounter (Signed)
Pt called because he is to have surgery per Dr Ophelia Charter, he says that he has not yet been scheduled and he is out; the pt is scheduled to see Dr Collie Siad, Ernesto Rutherford, 07/17/18; spoke with Steward Drone at [facility] and she says that the pt has not had his initial new pt visit, and he should contact  His surgeon; attempted to contact Dr Ophelia Charter, but office closed until 1230; will call pt back at 1230 to assist him in reaching this physician   Reason for Disposition . [1] Pain radiates into the thigh or further down the leg AND [2] one leg  Answer Assessment - Initial Assessment Questions 1. ONSET: "When did the pain begin?"      Seen by orthopedics 06/21/18 2. LOCATION: "Where does it hurt?" (upper, mid or lower back)     lower 3. SEVERITY: "How bad is the pain?"  (e.g., Scale 1-10; mild, moderate, or severe)   - MILD (1-3): doesn't interfere with normal activities    - MODERATE (4-7): interferes with normal activities or awakens from sleep    - SEVERE (8-10): excruciating pain, unable to do any normal activities      severe 4. PATTERN: "Is the pain constant?" (e.g., yes, no; constant, intermittent)      constant 5. RADIATION: "Does the pain shoot into your legs or elsewhere?"     *No Answer* 6. CAUSE:  "What do you think is causing the back pain?"      Pt to have surgery; not yet scheduled 7. BACK OVERUSE:  "Any recent lifting of heavy objects, strenuous work or exercise?"     *No Answer* 8. MEDICATIONS: "What have you taken so far for the pain?" (e.g., nothing, acetaminophen, NSAIDS)      Hydrocodone-acetaminophen 5-325 9. NEUROLOGIC SYMPTOMS: "Do you have any weakness, numbness, or problems with bowel/bladder control?"     no 10. OTHER SYMPTOMS: "Do you have any other symptoms?" (e.g., fever, abdominal pain, burning with urination, blood in urine)       no 11. PREGNANCY: "Is there any chance you are pregnant?" (e.g., yes, no; LMP)       n/a  Protocols used: BACK PAIN-A-AH

## 2018-07-15 NOTE — Telephone Encounter (Signed)
Pt connected with Dr Ophelia Charter office for request for medication and scheduling; he spoke with Bri.

## 2018-07-15 NOTE — Telephone Encounter (Signed)
Patient called states that he has not heard anything about a surgery date yet. States he has seen cardiologist and tests were ok. He is in severe pain and needs something until surgery is scheduled.  Call back # [phone]

## 2018-07-15 NOTE — Telephone Encounter (Signed)
Tramadol refill called to pharmacy.   Can you please let patient know about surgery scheduling. See below.

## 2018-07-17 ENCOUNTER — Encounter: Payer: Self-pay | Admitting: Family Medicine

## 2018-07-17 ENCOUNTER — Ambulatory Visit (INDEPENDENT_AMBULATORY_CARE_PROVIDER_SITE_OTHER): Payer: 59 | Admitting: Family Medicine

## 2018-07-17 ENCOUNTER — Other Ambulatory Visit: Payer: Self-pay

## 2018-07-17 VITALS — BP 119/81 | HR 99 | Temp 98.2°F | Resp 18 | Ht 71.0 in | Wt 262.4 lb

## 2018-07-17 DIAGNOSIS — Z1211 Encounter for screening for malignant neoplasm of colon: Secondary | ICD-10-CM

## 2018-07-17 DIAGNOSIS — M5126 Other intervertebral disc displacement, lumbar region: Secondary | ICD-10-CM | POA: Diagnosis not present

## 2018-07-17 DIAGNOSIS — Z131 Encounter for screening for diabetes mellitus: Secondary | ICD-10-CM

## 2018-07-17 DIAGNOSIS — I10 Essential (primary) hypertension: Secondary | ICD-10-CM

## 2018-07-17 DIAGNOSIS — F17210 Nicotine dependence, cigarettes, uncomplicated: Secondary | ICD-10-CM | POA: Diagnosis not present

## 2018-07-17 DIAGNOSIS — R350 Frequency of micturition: Secondary | ICD-10-CM

## 2018-07-17 DIAGNOSIS — N401 Enlarged prostate with lower urinary tract symptoms: Secondary | ICD-10-CM

## 2018-07-17 DIAGNOSIS — K219 Gastro-esophageal reflux disease without esophagitis: Secondary | ICD-10-CM

## 2018-07-17 DIAGNOSIS — E785 Hyperlipidemia, unspecified: Secondary | ICD-10-CM | POA: Diagnosis not present

## 2018-07-17 DIAGNOSIS — Z5181 Encounter for therapeutic drug level monitoring: Secondary | ICD-10-CM

## 2018-07-17 DIAGNOSIS — F3130 Bipolar disorder, current episode depressed, mild or moderate severity, unspecified: Secondary | ICD-10-CM

## 2018-07-17 MED ORDER — AMLODIPINE BESYLATE 10 MG PO TABS: 10.0000 mg | ORAL_TABLET | Freq: Every day | ORAL | 1 refills | Status: AC

## 2018-07-17 MED ORDER — HYDROCHLOROTHIAZIDE 25 MG PO TABS: 25.0000 mg | ORAL_TABLET | Freq: Every day | ORAL | 1 refills | Status: AC

## 2018-07-17 MED ORDER — OMEPRAZOLE 20 MG PO CPDR: 20.0000 mg | DELAYED_RELEASE_CAPSULE | Freq: Every day | ORAL | 3 refills | Status: AC

## 2018-07-17 MED ORDER — CLONIDINE HCL 0.1 MG PO TABS: 0.1000 mg | ORAL_TABLET | Freq: Two times a day (BID) | ORAL | 1 refills | Status: AC

## 2018-07-17 MED ORDER — ATORVASTATIN CALCIUM 40 MG PO TABS: 40.0000 mg | ORAL_TABLET | Freq: Every day | ORAL | 1 refills | Status: AC

## 2018-07-17 MED ORDER — TAMSULOSIN HCL 0.4 MG PO CAPS: 0.4000 mg | ORAL_CAPSULE | Freq: Every day | ORAL | 1 refills | Status: AC

## 2018-07-17 NOTE — Progress Notes (Signed)
Chief Complaint  Patient presents with  . Establish Care    check up on blood pressure, need refills on all medication    HPI  Patient is here to establish care with Donald new provider  Hypertension: Patient here for follow-up of elevated blood pressure. He is not exercising and is adherent to low salt diet.  Blood pressure is well controlled at home. Cardiac symptoms none. Patient denies chest pain, claudication, dyspnea, exertional chest pressure/discomfort, irregular heart beat and lower extremity edema.  Cardiovascular risk factors: dyslipidemia, hypertension, male gender and sedentary lifestyle.   Lipid Disorder Pt reports Donald history of high cholesteorl. He states that he takes lipitor 40mg . States that he does not get muscle pain due to the atorvastatin. Does not take any medications that worsen atorvastatin.  No history of stroke or claudication.   Colon Cancer Screening He has never had Donald colonoscopy He denies blood in his stool, unexpected weight loss or pain with defecation No rectal itching He smokes He does not have Donald family history of colon cancer  Depression and Bipolar Disorder Pt was previously seeing Donald Psychiatrist Depression screen [facility] 2/9 07/17/2018 07/17/2018 07/17/2018 07/17/2018  Decreased Interest 0 0 0 0  Down, Depressed, Hopeless 0 0 0 0  PHQ - 2 Score 0 0 0 0   Reports that he feels very stable currently without deterioration of his mood. He is on depakote, remeron, seroquel and trazodone He denies auditory and visual hallucinations    Past Medical History:  Diagnosis Date  . Anxiety   . Arthritis   . Depression   . GERD (gastroesophageal reflux disease)   . Hypertension   . Pneumonia   . Sleep apnea    does not use cpap,  had tonsillectomy    Current Outpatient Medications  Medication Sig Dispense Refill  . amLODipine (NORVASC) 10 MG tablet Take 1 tablet (10 mg total) by mouth daily. 90 tablet 1  . atorvastatin (LIPITOR) 40 MG tablet Take 1  tablet (40 mg total) by mouth at bedtime. 90 tablet 1  . cloNIDine (CATAPRES) 0.1 MG tablet Take 1 tablet (0.1 mg total) by mouth 2 (two) times daily. 180 tablet 1  . divalproex (DEPAKOTE) 500 MG DR tablet Take 500-1,000 mg by mouth See admin instructions. 1 tablet every morning and 2 tablets every night at bedtime     . hydrochlorothiazide (HYDRODIURIL) 25 MG tablet Take 1 tablet (25 mg total) by mouth daily. 90 tablet 1  . hydrOXYzine (ATARAX/VISTARIL) 50 MG tablet Take 50 mg by mouth 3 (three) times daily.    Marland Kitchen ibuprofen (ADVIL,MOTRIN) 200 MG tablet Take 800 mg by mouth daily as needed for moderate pain.    . Menthol, Topical Analgesic, (BIOFREEZE EX) Apply 1 application topically daily as needed (pain).    . methocarbamol (ROBAXIN) 500 MG tablet TAKE 1 TABLET BY MOUTH EVERY 6 HOURS AS NEEDED FOR MUSCLE SPASMS 60 tablet 0  . mirtazapine (REMERON) 15 MG tablet Take 15 mg by mouth at bedtime.    Marland Kitchen omeprazole (PRILOSEC) 20 MG capsule Take 1 capsule (20 mg total) by mouth daily. 90 capsule 3  . oxyCODONE-acetaminophen (PERCOCET/ROXICET) 5-325 MG tablet Take 2 tablets by mouth every 6 (six) hours as needed for severe pain. 50 tablet 0  . oxyCODONE-acetaminophen (PERCOCET/ROXICET) 5-325 MG tablet Take 1-2 tablets by mouth every 6 (six) hours as needed for severe pain. 35 tablet 0  . QUEtiapine (SEROQUEL) 100 MG tablet Take 100 mg by mouth at bedtime.    Marland Kitchen  tamsulosin (FLOMAX) 0.4 MG CAPS capsule Take 1 capsule (0.4 mg total) by mouth daily. 90 capsule 1  . traZODone (DESYREL) 50 MG tablet Take 25-50 mg by mouth See admin instructions. Take 25 mg at 7pm and take 50 mg at bedtime     No current facility-administered medications for this visit.     Allergies:  Allergies  Allergen Reactions  . Lisinopril Swelling    SWELLING REACTION UNSPECIFIED     Past Surgical History:  Procedure Laterality Date  . LUMBAR LAMINECTOMY/DECOMPRESSION MICRODISCECTOMY N/Donald 07/22/2018   Procedure: RIGHT L4-5  MICRODISCECTOMY,  RIGHT L4 HEMILAMINECTOMY;  Surgeon: Eldred Manges, MD;  Location: [facility] OR;  Service: Orthopedics;  Laterality: N/Donald;  . TONSILLECTOMY      Social History   Socioeconomic History  . Marital status: Significant Other    Spouse name: Not on file  . Number of children: Not on file  . Years of education: Not on file  . Highest education level: Not on file  Occupational History  . Not on file  Social Needs  . Financial resource strain: Not on file  . Food insecurity:    Worry: Not on file    Inability: Not on file  . Transportation needs:    Medical: Not on file    Non-medical: Not on file  Tobacco Use  . Smoking status: Current Every Day Smoker    Packs/day: 0.50    Years: 35.00    Pack years: 17.50    Types: Cigarettes  . Smokeless tobacco: Never Used  Substance and Sexual Activity  . Alcohol use: Yes    Comment: occasional  . Drug use: Yes    Types: Cocaine, Marijuana    Comment: "if i'm at Donald party"  . Sexual activity: Yes  Lifestyle  . Physical activity:    Days per week: Not on file    Minutes per session: Not on file  . Stress: Not on file  Relationships  . Social connections:    Talks on phone: Not on file    Gets together: Not on file    Attends religious service: Not on file    Active member of club or organization: Not on file    Attends meetings of clubs or organizations: Not on file    Relationship status: Not on file  Other Topics Concern  . Not on file  Social History Narrative  . Not on file    No family history on file.   ROS Review of Systems See HPI Constitution: No fevers or chills No malaise No diaphoresis Skin: No rash or itching Eyes: no blurry vision, no double vision GU: no dysuria or hematuria Neuro: no dizziness or headaches all others reviewed and negative   Objective: Vitals:   07/17/18 1216  BP: 119/81  Pulse: 99  Resp: 18  Temp: 98.2 F (36.8 C)  TempSrc: Oral  SpO2: 96%  Weight: 262 lb 6.4 oz (119  kg)  Height: 5\' 11"  (1.803 m)    Physical Exam Physical Exam  Constitutional: He is oriented to person, place, and time. He appears well-developed and well-nourished.  HENT:  Head: Normocephalic and atraumatic.  Eyes: Conjunctivae and EOM are normal.  Cardiovascular: Normal rate, regular rhythm, normal heart sounds and intact distal pulses.  No murmur heard. Pulmonary/Chest: Effort normal and breath sounds normal. No stridor. No respiratory distress. He has no wheezes.  Neurological: He is alert and oriented to person, place, and time.  Skin: Skin is warm. Capillary  refill takes less than 2 seconds.  Psychiatric: He has Donald normal mood and affect. His behavior is normal. Judgment and thought content normal.     Assessment and Plan Donald Hess was seen today for establish care.  Diagnoses and all orders for this visit:  Essential hypertension- Patient's blood pressure is at goal of 139/89 or less. Condition is stable. Continue current medications and treatment plan. I recommend that you exercise for 30-45 minutes 5 days Donald week. I also recommend Donald balanced diet with fruits and vegetables every day, lean meats, and little fried foods. The DASH diet (you can find this online) is Donald good example of this. -     Comprehensive metabolic panel -     Ambulatory referral to Ophthalmology -     hydrochlorothiazide (HYDRODIURIL) 25 MG tablet; Take 1 tablet (25 mg total) by mouth daily. -     amLODipine (NORVASC) 10 MG tablet; Take 1 tablet (10 mg total) by mouth daily. -     cloNIDine (CATAPRES) 0.1 MG tablet; Take 1 tablet (0.1 mg total) by mouth 2 (two) times daily.  Dyslipidemia -     Lipid panel -     Comprehensive metabolic panel -     atorvastatin (LIPITOR) 40 MG tablet; Take 1 tablet (40 mg total) by mouth at bedtime.  Lumbar herniated disc- will discuss further at the next visit  Cigarette smoker-  Discussed smoking cessation  Screening for diabetes mellitus -     Hemoglobin  A1c  Bipolar I disorder, most recent episode depressed (HCC)-  Referral to Psychiatry to establish care for continuity -     TSH -     Ambulatory referral to Psychiatry  Encounter for medication monitoring -     Ambulatory referral to Psychiatry  Gastroesophageal reflux disease without esophagitis- -Discussed GERD triggers -Discussed PPI and how to taper off -Advised eating smaller more frequent meals -Emphasis placed on taking reflux medications BEFORE eating -discussed when GI referral and Endoscopy is necessary  -     omeprazole (PRILOSEC) 20 MG capsule; Take 1 capsule (20 mg total) by mouth daily.  Benign prostatic hyperplasia with urinary frequency- continue current dose of flomax -     tamsulosin (FLOMAX) 0.4 MG CAPS capsule; Take 1 capsule (0.4 mg total) by mouth daily.  Screen for colon cancer -     Cologuard; Future -     Cologuard     Donald Hess Donald Hess

## 2018-07-17 NOTE — Patient Instructions (Signed)
° ° ° °  If you have lab work done today you will be contacted with your lab results within the next 2 weeks.  If you have not heard from us then please contact us. The fastest way to get your results is to register for My Chart. ° ° °IF you received an x-ray today, you will receive an invoice from [facility]. Please contact [facility] at [phone] with questions or concerns regarding your invoice.  ° °IF you received labwork today, you will receive an invoice from [facility]. Please contact [facility] at [phone] with questions or concerns regarding your invoice.  ° °Our billing staff will not be able to assist you with questions regarding bills from these companies. ° °You will be contacted with the lab results as soon as they are available. The fastest way to get your results is to activate your My Chart account. Instructions are located on the last page of this paperwork. If you have not heard from us regarding the results in 2 weeks, please contact this office. °  ° ° ° °

## 2018-07-18 ENCOUNTER — Other Ambulatory Visit: Payer: Self-pay

## 2018-07-18 ENCOUNTER — Encounter (HOSPITAL_COMMUNITY): Payer: Self-pay | Admitting: *Deleted

## 2018-07-18 ENCOUNTER — Ambulatory Visit (HOSPITAL_COMMUNITY): Payer: 59 | Admitting: Anesthesiology

## 2018-07-18 LAB — TSH: TSH: 1.24 u[IU]/mL (ref 0.450–4.500)

## 2018-07-18 LAB — COMPREHENSIVE METABOLIC PANEL
ALT: 39 IU/L (ref 0–44)
AST: 24 IU/L (ref 0–40)
Albumin/Globulin Ratio: 1.8 (ref 1.2–2.2)
Albumin: 4.3 g/dL (ref 3.5–5.5)
Alkaline Phosphatase: 59 IU/L (ref 39–117)
BUN / CREAT RATIO: 10 (ref 9–20)
BUN: 9 mg/dL (ref 6–24)
Bilirubin Total: 0.3 mg/dL (ref 0.0–1.2)
CALCIUM: 9.3 mg/dL (ref 8.7–10.2)
CO2: 21 mmol/L (ref 20–29)
CREATININE: 0.86 mg/dL (ref 0.76–1.27)
Chloride: 105 mmol/L (ref 96–106)
GFR, EST AFRICAN AMERICAN: 117 mL/min/{1.73_m2} (ref >59)
GFR, EST NON AFRICAN AMERICAN: 101 mL/min/{1.73_m2} (ref >59)
GLUCOSE: 102 mg/dL — AB (ref 65–99)
Globulin, Total: 2.4 g/dL (ref 1.5–4.5)
Potassium: 4 mmol/L (ref 3.5–5.2)
Sodium: 143 mmol/L (ref 134–144)
TOTAL PROTEIN: 6.7 g/dL (ref 6.0–8.5)

## 2018-07-18 LAB — LIPID PANEL
CHOLESTEROL TOTAL: 219 mg/dL — AB (ref 100–199)
Chol/HDL Ratio: 5.1 ratio — ABNORMAL HIGH (ref 0.0–5.0)
HDL: 43 mg/dL (ref >39)
LDL CALC: 143 mg/dL — AB (ref 0–99)
TRIGLYCERIDES: 167 mg/dL — AB (ref 0–149)
VLDL Cholesterol Cal: 33 mg/dL (ref 5–40)

## 2018-07-18 LAB — HEMOGLOBIN A1C
Est. average glucose Bld gHb Est-mCnc: 123 mg/dL
Hgb A1c MFr Bld: 5.9 % — ABNORMAL HIGH (ref 4.8–5.6)

## 2018-07-18 NOTE — Progress Notes (Signed)
Spoke with pt for pre-op call. Pt had cardiology work up by Dr. Tomie China, with Echo and Stress test done on 07/04/18. Note by Dr. Tomie China on Echo stating tests were fine. Pt denies any recent chest pain or sob. Pt states he is not diabetic.

## 2018-07-18 NOTE — Anesthesia Preprocedure Evaluation (Deleted)
Anesthesia Evaluation    Reviewed: Allergy & Precautions, Patient's Chart, lab work & pertinent test results  Airway        Dental   Pulmonary sleep apnea , Current Smoker,           Cardiovascular hypertension, Pt. on medications      Neuro/Psych PSYCHIATRIC DISORDERS Anxiety Depression right L4-5 herniated nucleus pulposus, free fragments  Neuromuscular disease    GI/Hepatic GERD  Medicated,(+)     substance abuse  cocaine use and marijuana use,   Endo/Other  negative endocrine ROSObesity   Renal/GU negative Renal ROS     Musculoskeletal  (+) Arthritis ,   Abdominal   Peds  Hematology negative hematology ROS (+)   Anesthesia Other Findings Day of surgery medications reviewed with the patient.  Reproductive/Obstetrics                             Anesthesia Physical Anesthesia Plan  ASA: III  Anesthesia Plan: General   Post-op Pain Management:    Induction: Intravenous  PONV Risk Score and Plan: 2 and Dexamethasone, Ondansetron and Midazolam  Airway Management Planned: Oral ETT  Additional Equipment:   Intra-op Plan:   Post-operative Plan: Extubation in OR  Informed Consent:   Plan Discussed with:   Anesthesia Plan Comments:         Anesthesia Quick Evaluation

## 2018-07-19 ENCOUNTER — Other Ambulatory Visit: Payer: Self-pay

## 2018-07-19 ENCOUNTER — Ambulatory Visit (HOSPITAL_COMMUNITY)
Admission: RE | Admit: 2018-07-19 | Discharge: 2018-07-19 | Disposition: A | Discharge status: Home / Self Care | Payer: 59 | Source: Ambulatory Visit | Attending: Orthopaedic Surgery | Admitting: Orthopaedic Surgery

## 2018-07-19 ENCOUNTER — Encounter (HOSPITAL_COMMUNITY): Payer: Self-pay

## 2018-07-19 ENCOUNTER — Encounter (HOSPITAL_COMMUNITY)
Admission: RE | Disposition: A | Discharge status: Home / Self Care | Payer: Self-pay | Source: Ambulatory Visit | Attending: Orthopaedic Surgery

## 2018-07-19 DIAGNOSIS — I10 Essential (primary) hypertension: Secondary | ICD-10-CM | POA: Diagnosis not present

## 2018-07-19 DIAGNOSIS — Z79899 Other long term (current) drug therapy: Secondary | ICD-10-CM | POA: Diagnosis not present

## 2018-07-19 DIAGNOSIS — Z538 Procedure and treatment not carried out for other reasons: Secondary | ICD-10-CM | POA: Diagnosis not present

## 2018-07-19 DIAGNOSIS — F17210 Nicotine dependence, cigarettes, uncomplicated: Secondary | ICD-10-CM | POA: Insufficient documentation

## 2018-07-19 DIAGNOSIS — M5126 Other intervertebral disc displacement, lumbar region: Secondary | ICD-10-CM | POA: Diagnosis present

## 2018-07-19 DIAGNOSIS — M5116 Intervertebral disc disorders with radiculopathy, lumbar region: Secondary | ICD-10-CM | POA: Diagnosis not present

## 2018-07-19 HISTORY — DX: Pneumonia, unspecified organism: J18.9

## 2018-07-19 HISTORY — DX: Gastro-esophageal reflux disease without esophagitis: K21.9

## 2018-07-19 HISTORY — DX: Sleep apnea, unspecified: G47.30

## 2018-07-19 LAB — CBC
HCT: 47 % (ref 39.0–52.0)
HEMOGLOBIN: 15.1 g/dL (ref 13.0–17.0)
MCH: 30.4 pg (ref 26.0–34.0)
MCHC: 32.1 g/dL (ref 30.0–36.0)
MCV: 94.8 fL (ref 80.0–100.0)
PLATELETS: 222 10*3/uL (ref 150–400)
RBC: 4.96 MIL/uL (ref 4.22–5.81)
RDW: 13.1 % (ref 11.5–15.5)
WBC: 6.8 10*3/uL (ref 4.0–10.5)
nRBC: 0 % (ref 0.0–0.2)

## 2018-07-19 SURGERY — LUMBAR LAMINECTOMY/DECOMPRESSION MICRODISCECTOMY
Anesthesia: General

## 2018-07-19 MED ORDER — MIDAZOLAM HCL 2 MG/2ML IJ SOLN
INTRAMUSCULAR | Status: AC
  Filled 2018-07-19: qty 2

## 2018-07-19 MED ORDER — PROPOFOL 10 MG/ML IV BOLUS
INTRAVENOUS | Status: AC
  Filled 2018-07-19: qty 40

## 2018-07-19 MED ORDER — LIDOCAINE 2% (20 MG/ML) 5 ML SYRINGE
INTRAMUSCULAR | Status: AC
  Filled 2018-07-19: qty 5

## 2018-07-19 MED ORDER — FENTANYL CITRATE (PF) 250 MCG/5ML IJ SOLN
INTRAMUSCULAR | Status: AC
  Filled 2018-07-19: qty 5

## 2018-07-19 SURGICAL SUPPLY — 42 items
BUR ROUND FLUTED 4 SOFT TCH (BURR) IMPLANT
BUR ROUND FLUTED 4MM SOFT TCH (BURR)
CANISTER SUCT 3000ML PPV (MISCELLANEOUS) ×3 IMPLANT
CLOSURE STERI-STRIP 1/2X4 (GAUZE/BANDAGES/DRESSINGS) ×1
CLSR STERI-STRIP ANTIMIC 1/2X4 (GAUZE/BANDAGES/DRESSINGS) ×2 IMPLANT
COVER SURGICAL LIGHT HANDLE (MISCELLANEOUS) ×3 IMPLANT
COVER WAND RF STERILE (DRAPES) ×3 IMPLANT
DECANTER SPIKE VIAL GLASS SM (MISCELLANEOUS) ×3 IMPLANT
DRAPE HALF SHEET 40X57 (DRAPES) ×6 IMPLANT
DRAPE MICROSCOPE LEICA (MISCELLANEOUS) ×3 IMPLANT
DRAPE SURG 17X23 STRL (DRAPES) ×3 IMPLANT
DRSG MEPILEX BORDER 4X4 (GAUZE/BANDAGES/DRESSINGS) ×3 IMPLANT
DURAPREP 26ML APPLICATOR (WOUND CARE) ×3 IMPLANT
ELECT REM PT RETURN 9FT ADLT (ELECTROSURGICAL) ×3
ELECTRODE REM PT RTRN 9FT ADLT (ELECTROSURGICAL) ×1 IMPLANT
GLOVE BIOGEL PI IND STRL 8 (GLOVE) ×2 IMPLANT
GLOVE BIOGEL PI INDICATOR 8 (GLOVE) ×4
GLOVE ORTHO TXT STRL SZ7.5 (GLOVE) ×6 IMPLANT
GOWN STRL REUS W/ TWL LRG LVL3 (GOWN DISPOSABLE) ×2 IMPLANT
GOWN STRL REUS W/ TWL XL LVL3 (GOWN DISPOSABLE) ×1 IMPLANT
GOWN STRL REUS W/TWL 2XL LVL3 (GOWN DISPOSABLE) ×3 IMPLANT
GOWN STRL REUS W/TWL LRG LVL3 (GOWN DISPOSABLE) ×4
GOWN STRL REUS W/TWL XL LVL3 (GOWN DISPOSABLE) ×2
KIT BASIN OR (CUSTOM PROCEDURE TRAY) ×3 IMPLANT
KIT TURNOVER KIT B (KITS) ×3 IMPLANT
MANIFOLD NEPTUNE II (INSTRUMENTS) ×3 IMPLANT
NEEDLE HYPO 25GX1X1/2 BEV (NEEDLE) ×3 IMPLANT
NEEDLE SPNL 18GX3.5 QUINCKE PK (NEEDLE) ×3 IMPLANT
NS IRRIG 1000ML POUR BTL (IV SOLUTION) ×3 IMPLANT
PACK LAMINECTOMY ORTHO (CUSTOM PROCEDURE TRAY) ×3 IMPLANT
PAD ARMBOARD 7.5X6 YLW CONV (MISCELLANEOUS) ×6 IMPLANT
PATTIES SURGICAL .5 X.5 (GAUZE/BANDAGES/DRESSINGS) IMPLANT
PATTIES SURGICAL .75X.75 (GAUZE/BANDAGES/DRESSINGS) IMPLANT
SUT VIC AB 0 CT1 27 (SUTURE)
SUT VIC AB 0 CT1 27XBRD ANBCTR (SUTURE) IMPLANT
SUT VIC AB 1 CTX 36 (SUTURE) ×2
SUT VIC AB 1 CTX36XBRD ANBCTR (SUTURE) ×1 IMPLANT
SUT VIC AB 2-0 CT1 27 (SUTURE) ×2
SUT VIC AB 2-0 CT1 TAPERPNT 27 (SUTURE) ×1 IMPLANT
SUT VIC AB 3-0 X1 27 (SUTURE) ×3 IMPLANT
TOWEL OR 17X24 6PK STRL BLUE (TOWEL DISPOSABLE) ×3 IMPLANT
TOWEL OR 17X26 10 PK STRL BLUE (TOWEL DISPOSABLE) ×3 IMPLANT

## 2018-07-19 NOTE — H&P (View-Only) (Signed)
Patient: Donald Hess                                      Date of Birth: 02/23/1966                                                  MRN: [id number] Visit Date: 06/21/2018                                                                     Requested by: No referring provider defined for this encounter. PCP: Patient, No Pcp Per   Assessment & Plan: Visit Diagnoses:  1. Lumbar herniated disc   2. Sciatica, right side     Plan: With patient's progressively worsening symptoms along with right leg weakness and findings on MRI best course of treatment at this point would be right L4-5 microdiscectomy.  Surgery procedure along with potential risks and comp occasions discussed with patient in great detail by myself and Dr. Ophelia Charter.  All questions answered.  He states that he has some courses upcoming the next couple weekends but would like to have surgery the week of October 8.  I reviewed patient's medication and past medical history.  He states that he has seen a cardiologist a couple years ago while in [place] and had a stress test and at that time was advised that he had changes consistent with an MI.  We need to get him established with a primary care physician and also evaluated by cardiology before his surgery.  We will assist with this.  All questions answered.  Follow-Up Instructions: Return for need return office visit 1 week postop.   Orders:  No orders of the defined types were placed in this encounter.  No orders of the defined types were placed in this encounter.     Procedures: No procedures performed   Clinical Data: No additional findings.   Subjective:    Chief Complaint  Patient presents with  . Lower Back - Pain    HPI 52 year old black male is referred by Dr. Doneen Poisson for worsening low back pain and right lower extremity radiculopathy.  Patient states that he has had problems with his low back for several years but  this is been progressively getting worse over the last 3 months.  No new injury.  States that he has pain in the low back that radiates down to the right buttock and down to his right foot.  Also complains of right leg weakness and numbness and tingling.  No symptoms on the left side.  He has been seen in the emergency room and treated with injections and muscle relaxers.  Dr. Magnus Ivan ordered a lumbar MRI and that showed L4-5 disc degeneration with right posterior lateral disc herniation with a 1 cm disc fragment migrated upward in the right lateral recess behind L4.  This would likely compress the right L4 nerve.  There is a bilateral facet also arthritis at this level which could contribute to back pain.  L5-S1 normal appearance  of the disc.  Facet osteoarthritis right worse than left that could cause low back pain.  Last of years in [place] he has had conservative management with lumbar ESI's that did not give any relief.  He denies any complaints of bowel or bladder incontinence.  Patient just recently moved here several months ago and is not established with a primary care physician.  He states that he has seen a cardiologist while living in [place] a couple years ago and had a stress test and was told that he had changes consistent with a possible MI.  We do not have any of those records were reviewed. Review of Systems No current cardiac pulmonary GI GU issues  Objective: Vital Signs: BP (!) 144/92   Pulse (!) 110   Ht 6' (1.829 m)   Wt 255 lb (115.7 kg)   BMI 34.58 kg/m   Physical Exam  Constitutional: He is oriented to person, place, and time. No distress.  HENT:  Head: Normocephalic and atraumatic.  Eyes: Pupils are equal, round, and reactive to light. EOM are normal.  Neck: Normal range of motion.  Cardiovascular: Normal rate.  Pulmonary/Chest: Breath sounds normal. No respiratory distress.  Abdominal: He exhibits no distension.  Musculoskeletal:  Gait antalgic.  Lumbar  paraspinal tenderness.  Negative log about his.  Positive right straight leg raise.  Bilateral calves nontender.  Trace right anterior tib and gastroc weakness.  Neurological: He is alert and oriented to person, place, and time.  Skin: Skin is warm and dry.  Psychiatric: He has a normal mood and affect.    Ortho Exam  Specialty Comments:  No specialty comments available.  Imaging: No results found.   PMFS History:     Patient Active Problem List   Diagnosis Date Noted  . Sciatica, right side 06/18/2018  . Lumbar herniated disc 06/18/2018       Past Medical History:  Diagnosis Date  . Arthritis   . Hypertension     History reviewed. No pertinent family history.       Past Surgical History:  Procedure Laterality Date  . TONSILLECTOMY     Social History        Occupational History  . Not on file  Tobacco Use  . Smoking status: Current Every Day Smoker    Packs/day: 0.50    Years: 35.00    Pack years: 17.50  . Smokeless tobacco: Never Used  Substance and Sexual Activity  . Alcohol use: Yes  . Drug use: Yes    Types: Cocaine, Marijuana    Comment: "if i'm at a party"  . Sexual activity: Not on file              Electronically signed by Naida Sleight, PA-C at 06/21/2018 2:25 PM

## 2018-07-19 NOTE — H&P (Signed)
Patient: Donald Hess                                      Date of Birth: 09/20/1966                                                  MRN: [id number] Visit Date: 06/21/2018                                                                     Requested by: No referring provider defined for this encounter. PCP: Patient, No Pcp Per   Assessment & Plan: Visit Diagnoses:  1. Lumbar herniated disc   2. Sciatica, right side     Plan: With patient's progressively worsening symptoms along with right leg weakness and findings on MRI best course of treatment at this point would be right L4-5 microdiscectomy.  Surgery procedure along with potential risks and comp occasions discussed with patient in great detail by myself and Dr. Yates.  All questions answered.  He states that he has some courses upcoming the next couple weekends but would like to have surgery the week of October 8.  I reviewed patient's medication and past medical history.  He states that he has seen a cardiologist a couple years ago while in [place] and had a stress test and at that time was advised that he had changes consistent with an MI.  We need to get him established with a primary care physician and also evaluated by cardiology before his surgery.  We will assist with this.  All questions answered.  Follow-Up Instructions: Return for need return office visit 1 week postop.   Orders:  No orders of the defined types were placed in this encounter.  No orders of the defined types were placed in this encounter.     Procedures: No procedures performed   Clinical Data: No additional findings.   Subjective:    Chief Complaint  Patient presents with  . Lower Back - Pain    HPI 51-year-old black male is referred by Dr. Christopher Blackman for worsening low back pain and right lower extremity radiculopathy.  Patient states that he has had problems with his low back for several years but  this is been progressively getting worse over the last 3 months.  No new injury.  States that he has pain in the low back that radiates down to the right buttock and down to his right foot.  Also complains of right leg weakness and numbness and tingling.  No symptoms on the left side.  He has been seen in the emergency room and treated with injections and muscle relaxers.  Dr. Blackman ordered a lumbar MRI and that showed L4-5 disc degeneration with right posterior lateral disc herniation with a 1 cm disc fragment migrated upward in the right lateral recess behind L4.  This would likely compress the right L4 nerve.  There is a bilateral facet also arthritis at this level which could contribute to back pain.  L5-S1 normal appearance   of the disc.  Facet osteoarthritis right worse than left that could cause low back pain.  Last of years in [place] he has had conservative management with lumbar ESI's that did not give any relief.  He denies any complaints of bowel or bladder incontinence.  Patient just recently moved here several months ago and is not established with a primary care physician.  He states that he has seen a cardiologist while living in [place] a couple years ago and had a stress test and was told that he had changes consistent with a possible MI.  We do not have any of those records were reviewed. Review of Systems No current cardiac pulmonary GI GU issues  Objective: Vital Signs: BP (!) 144/92   Pulse (!) 110   Ht 6' (1.829 m)   Wt 255 lb (115.7 kg)   BMI 34.58 kg/m   Physical Exam  Constitutional: He is oriented to person, place, and time. No distress.  HENT:  Head: Normocephalic and atraumatic.  Eyes: Pupils are equal, round, and reactive to light. EOM are normal.  Neck: Normal range of motion.  Cardiovascular: Normal rate.  Pulmonary/Chest: Breath sounds normal. No respiratory distress.  Abdominal: He exhibits no distension.  Musculoskeletal:  Gait antalgic.  Lumbar  paraspinal tenderness.  Negative log about his.  Positive right straight leg raise.  Bilateral calves nontender.  Trace right anterior tib and gastroc weakness.  Neurological: He is alert and oriented to person, place, and time.  Skin: Skin is warm and dry.  Psychiatric: He has a normal mood and affect.    Ortho Exam  Specialty Comments:  No specialty comments available.  Imaging: No results found.   PMFS History:     Patient Active Problem List   Diagnosis Date Noted  . Sciatica, right side 06/18/2018  . Lumbar herniated disc 06/18/2018       Past Medical History:  Diagnosis Date  . Arthritis   . Hypertension     History reviewed. No pertinent family history.       Past Surgical History:  Procedure Laterality Date  . TONSILLECTOMY     Social History        Occupational History  . Not on file  Tobacco Use  . Smoking status: Current Every Day Smoker    Packs/day: 0.50    Years: 35.00    Pack years: 17.50  . Smokeless tobacco: Never Used  Substance and Sexual Activity  . Alcohol use: Yes  . Drug use: Yes    Types: Cocaine, Marijuana    Comment: "if i'm at a party"  . Sexual activity: Not on file              Electronically signed by Owens, James M, PA-C at 06/21/2018 2:25 PM   

## 2018-07-19 NOTE — Progress Notes (Signed)
Patient ID: Donald Hess, male   DOB: 1966-07-31, 52 y.o.   MRN: [id number] Pt drank coffee and cream this AM. I have full clinic starting at 12:30 PM and surgery will be moved to Monday. Discussed with pt .

## 2018-07-19 NOTE — Progress Notes (Signed)
Called pt with arrival time for surgery on Monday, 07/22/18. Pt's surgery was cancelled this AM because pt had coffee with cream in it prior to arrival. Pt was instructed to take the medications that I had instructed him to take the morning of surgery. Pt was given CHG soap to use Sunday PM and Monday AM when he left today, I reminded him to use that soap. Again I told pt nothing to eat or drink after midnight Sunday, 07/21/18. He voiced understanding.

## 2018-07-19 NOTE — Pre-Procedure Instructions (Signed)
Donald Hess  07/19/2018      [id number] [place], [place] - [address] [place] [address] Phone: [phone] Fax: [phone]    Your procedure is scheduled on - we will contact with you with date and time.   Report to [facility] Admitting    Call this number if you have problems the morning of surgery:  [phone]   Remember:  Do not eat or drink after midnight.                   Take these medicines the morning of surgery with A SIP OF WATER:     Amlodipine (Norvasc)    Clonidine (Catapres)    Divalproex (Depakote)    Hydroxyzine (Atarax/Vistaril)    Omeprazole (Prilosec)    Tamsulosin (Flomax)    Tramadol (Ultram)  If needed.     Do not wear jewelry  Do not wear lotions, powders, or colognes, or deodorant.  Do not shave 48 hours prior to surgery.  Men may shave face and neck.  Do not bring valuables to the hospital.  [facility] is not responsible for any belongings or valuables.  Contacts, dentures or bridgework may not be worn into surgery.  Leave your suitcase in the car.  After surgery it may be brought to your room.  For patients admitted to the hospital, discharge time will be determined by your treatment team.  Patients discharged the day of surgery will not be allowed to drive home.   Special instructions:   [facility]- Preparing For Surgery  Before surgery, you can play an important role. Because skin is not sterile, your skin needs to be as free of germs as possible. You can reduce the number of germs on your skin by washing with CHG (chlorahexidine gluconate) Soap before surgery.  CHG is an antiseptic cleaner which kills germs and bonds with the skin to continue killing germs even after washing.    Oral Hygiene is also important to reduce your risk of infection.  Remember - BRUSH YOUR TEETH THE MORNING OF SURGERY WITH YOUR REGULAR TOOTHPASTE  Please do not use if you have an allergy to  CHG or antibacterial soaps. If your skin becomes reddened/irritated stop using the CHG.  Do not shave (including legs and underarms) for at least 48 hours prior to first CHG shower. It is OK to shave your face.  Please follow these instructions carefully.   1. Shower the NIGHT BEFORE SURGERY and the MORNING OF SURGERY with CHG.   2. If you chose to wash your hair, wash your hair first as usual with your normal shampoo.  3. After you shampoo, rinse your hair and body thoroughly to remove the shampoo.  4. Use CHG as you would any other liquid soap. You can apply CHG directly to the skin and wash gently with a scrungie or a clean washcloth.   5. Apply the CHG Soap to your body ONLY FROM THE NECK DOWN.  Do not use on open wounds or open sores. Avoid contact with your eyes, ears, mouth and genitals (private parts). Wash Face and genitals (private parts)  with your normal soap.  6. Wash thoroughly, paying special attention to the area where your surgery will be performed.  7. Thoroughly rinse your body with warm water from the neck down.  8. DO NOT shower/wash with your normal soap after using and rinsing off the CHG Soap.  9.  Pat yourself dry with a CLEAN TOWEL.  10. Wear CLEAN PAJAMAS to bed the night before surgery, wear comfortable clothes the morning of surgery  11. Place CLEAN SHEETS on your bed the night of your first shower and DO NOT SLEEP WITH PETS.    Day of Surgery:  Do not apply any deodorants/lotions.  Please wear clean clothes to the hospital/surgery center.   Remember to brush your teeth WITH YOUR REGULAR TOOTHPASTE.

## 2018-07-19 NOTE — Progress Notes (Signed)
Patient drank a cup of coffee with cream this morning at 0530.  Dr. Desmond Lope notified.  Per Dr. Desmond Lope surgery is delayed 6 hours.    Dr. Ophelia Charter notified.  Surgery is being cancelled and rescheduled.  Dr. Ophelia Charter is going to have his office call the patient with the new time.  Patient was given written instructions to not eat or drink after midnight the night before his surgery as well as the medications to take the morning of surgery.  Patient provided CHG soap and instructions on how to bathe.    No IV was started on this patient.  Patient was walked out by staff member, left with friend Izora Gala.

## 2018-07-22 ENCOUNTER — Ambulatory Visit (HOSPITAL_COMMUNITY): Payer: 59 | Admitting: Anesthesiology

## 2018-07-22 ENCOUNTER — Encounter (HOSPITAL_COMMUNITY)
Admission: RE | Disposition: A | Discharge status: Home / Self Care | Payer: Self-pay | Source: Ambulatory Visit | Attending: Orthopaedic Surgery

## 2018-07-22 ENCOUNTER — Other Ambulatory Visit: Payer: Self-pay

## 2018-07-22 ENCOUNTER — Encounter (HOSPITAL_COMMUNITY): Payer: Self-pay | Admitting: Surgery

## 2018-07-22 ENCOUNTER — Observation Stay (HOSPITAL_COMMUNITY)
Admission: RE | Admit: 2018-07-22 | Discharge: 2018-07-23 | Disposition: A | Discharge status: Home / Self Care | Payer: 59 | Source: Ambulatory Visit | Attending: Orthopaedic Surgery | Admitting: Orthopaedic Surgery

## 2018-07-22 ENCOUNTER — Ambulatory Visit (HOSPITAL_COMMUNITY): Payer: 59

## 2018-07-22 DIAGNOSIS — K219 Gastro-esophageal reflux disease without esophagitis: Secondary | ICD-10-CM | POA: Insufficient documentation

## 2018-07-22 DIAGNOSIS — Z419 Encounter for procedure for purposes other than remedying health state, unspecified: Secondary | ICD-10-CM

## 2018-07-22 DIAGNOSIS — M5126 Other intervertebral disc displacement, lumbar region: Secondary | ICD-10-CM

## 2018-07-22 DIAGNOSIS — F17210 Nicotine dependence, cigarettes, uncomplicated: Secondary | ICD-10-CM | POA: Diagnosis not present

## 2018-07-22 DIAGNOSIS — I10 Essential (primary) hypertension: Secondary | ICD-10-CM | POA: Insufficient documentation

## 2018-07-22 DIAGNOSIS — G4730 Sleep apnea, unspecified: Secondary | ICD-10-CM | POA: Insufficient documentation

## 2018-07-22 DIAGNOSIS — Z6835 Body mass index (BMI) 35.0-35.9, adult: Secondary | ICD-10-CM | POA: Diagnosis not present

## 2018-07-22 DIAGNOSIS — Z79899 Other long term (current) drug therapy: Secondary | ICD-10-CM | POA: Diagnosis not present

## 2018-07-22 DIAGNOSIS — M5116 Intervertebral disc disorders with radiculopathy, lumbar region: Secondary | ICD-10-CM | POA: Diagnosis present

## 2018-07-22 HISTORY — PX: LUMBAR LAMINECTOMY/DECOMPRESSION MICRODISCECTOMY: SHX5026

## 2018-07-22 SURGERY — LUMBAR LAMINECTOMY/DECOMPRESSION MICRODISCECTOMY
Anesthesia: General

## 2018-07-22 MED ORDER — ONDANSETRON HCL 4 MG/2ML IJ SOLN: 4.0000 mg | Freq: Four times a day (QID) | INTRAMUSCULAR | Status: DC | PRN

## 2018-07-22 MED ORDER — DEXTROSE 5 % IV SOLN
3.0000 g | INTRAVENOUS | Status: AC
  Administered 2018-07-22: 3 g via INTRAVENOUS
  Filled 2018-07-22: qty 3

## 2018-07-22 MED ORDER — ONDANSETRON HCL 4 MG PO TABS: 4.0000 mg | ORAL_TABLET | Freq: Four times a day (QID) | ORAL | Status: DC | PRN

## 2018-07-22 MED ORDER — HYDROMORPHONE HCL 1 MG/ML IJ SOLN: 0.5000 mg | INTRAMUSCULAR | Status: DC | PRN

## 2018-07-22 MED ORDER — SODIUM CHLORIDE 0.9% FLUSH: 3.0000 mL | INTRAVENOUS | Status: DC | PRN

## 2018-07-22 MED ORDER — MENTHOL 3 MG MT LOZG: 1.0000 | LOZENGE | OROMUCOSAL | Status: DC | PRN

## 2018-07-22 MED ORDER — SODIUM CHLORIDE 0.9 % IV SOLN: 250.0000 mL | INTRAVENOUS | Status: DC

## 2018-07-22 MED ORDER — SODIUM CHLORIDE 0.9% FLUSH
3.0000 mL | Freq: Two times a day (BID) | INTRAVENOUS | Status: DC
  Administered 2018-07-22: 3 mL via INTRAVENOUS

## 2018-07-22 MED ORDER — HYDROCHLOROTHIAZIDE 25 MG PO TABS
25.0000 mg | ORAL_TABLET | Freq: Every day | ORAL | Status: DC
  Administered 2018-07-22: 25 mg via ORAL
  Filled 2018-07-22: qty 1

## 2018-07-22 MED ORDER — BUPIVACAINE HCL (PF) 0.25 % IJ SOLN: INTRAMUSCULAR | Status: DC | PRN

## 2018-07-22 MED ORDER — SUGAMMADEX SODIUM 200 MG/2ML IV SOLN
INTRAVENOUS | Status: DC | PRN
  Administered 2018-07-22: 471.6 mg via INTRAVENOUS

## 2018-07-22 MED ORDER — METHOCARBAMOL 500 MG PO TABS: 500.0000 mg | ORAL_TABLET | Freq: Four times a day (QID) | ORAL | 0 refills | Status: DC | PRN

## 2018-07-22 MED ORDER — TRAZODONE HCL 50 MG PO TABS
50.0000 mg | ORAL_TABLET | Freq: Every day | ORAL | Status: DC
  Administered 2018-07-22: 50 mg via ORAL
  Filled 2018-07-22: qty 1

## 2018-07-22 MED ORDER — BUPIVACAINE HCL (PF) 0.25 % IJ SOLN
INTRAMUSCULAR | Status: AC
  Filled 2018-07-22: qty 30

## 2018-07-22 MED ORDER — PHENOL 1.4 % MT LIQD: 1.0000 | OROMUCOSAL | Status: DC | PRN

## 2018-07-22 MED ORDER — PHENYLEPHRINE 40 MCG/ML (10ML) SYRINGE FOR IV PUSH (FOR BLOOD PRESSURE SUPPORT)
PREFILLED_SYRINGE | INTRAVENOUS | Status: DC | PRN
  Administered 2018-07-22 (×5): 80 ug via INTRAVENOUS

## 2018-07-22 MED ORDER — MIDAZOLAM HCL 2 MG/2ML IJ SOLN
INTRAMUSCULAR | Status: AC
  Filled 2018-07-22: qty 2

## 2018-07-22 MED ORDER — LIDOCAINE 2% (20 MG/ML) 5 ML SYRINGE
INTRAMUSCULAR | Status: AC
  Filled 2018-07-22: qty 5

## 2018-07-22 MED ORDER — AMLODIPINE BESYLATE 5 MG PO TABS: 10.0000 mg | ORAL_TABLET | Freq: Every day | ORAL | Status: DC

## 2018-07-22 MED ORDER — PROPOFOL 10 MG/ML IV BOLUS
INTRAVENOUS | Status: DC | PRN
  Administered 2018-07-22: 100 mg via INTRAVENOUS
  Administered 2018-07-22: 200 mg via INTRAVENOUS
  Administered 2018-07-22: 50 mg via INTRAVENOUS

## 2018-07-22 MED ORDER — ACETAMINOPHEN 325 MG PO TABS: 650.0000 mg | ORAL_TABLET | ORAL | Status: DC | PRN

## 2018-07-22 MED ORDER — SODIUM CHLORIDE 0.9 % IV SOLN: INTRAVENOUS | Status: DC

## 2018-07-22 MED ORDER — PROPOFOL 10 MG/ML IV BOLUS
INTRAVENOUS | Status: AC
  Filled 2018-07-22: qty 40

## 2018-07-22 MED ORDER — PANTOPRAZOLE SODIUM 40 MG PO TBEC
40.0000 mg | DELAYED_RELEASE_TABLET | Freq: Every day | ORAL | Status: DC
  Administered 2018-07-22: 40 mg via ORAL
  Filled 2018-07-22: qty 1

## 2018-07-22 MED ORDER — MIDAZOLAM HCL 5 MG/5ML IJ SOLN
INTRAMUSCULAR | Status: DC | PRN
  Administered 2018-07-22: 2 mg via INTRAVENOUS

## 2018-07-22 MED ORDER — DIVALPROEX SODIUM 500 MG PO DR TAB: 500.0000 mg | DELAYED_RELEASE_TABLET | ORAL | Status: DC

## 2018-07-22 MED ORDER — DEXMEDETOMIDINE HCL IN NACL 200 MCG/50ML IV SOLN
INTRAVENOUS | Status: DC | PRN
  Administered 2018-07-22: 8 ug via INTRAVENOUS
  Administered 2018-07-22: 4 ug via INTRAVENOUS

## 2018-07-22 MED ORDER — MIDAZOLAM HCL 2 MG/2ML IJ SOLN: 0.5000 mg | Freq: Once | INTRAMUSCULAR | Status: DC | PRN

## 2018-07-22 MED ORDER — TRAZODONE 25 MG HALF TABLET: 25.0000 mg | ORAL_TABLET | ORAL | Status: DC

## 2018-07-22 MED ORDER — SODIUM CHLORIDE 0.9 % IV SOLN
INTRAVENOUS | Status: DC | PRN
  Administered 2018-07-22: 25 ug/min via INTRAVENOUS

## 2018-07-22 MED ORDER — TAMSULOSIN HCL 0.4 MG PO CAPS
0.4000 mg | ORAL_CAPSULE | Freq: Every day | ORAL | Status: DC
  Administered 2018-07-22: 0.4 mg via ORAL
  Filled 2018-07-22: qty 1

## 2018-07-22 MED ORDER — DOCUSATE SODIUM 100 MG PO CAPS
100.0000 mg | ORAL_CAPSULE | Freq: Two times a day (BID) | ORAL | Status: DC
  Administered 2018-07-22 (×2): 100 mg via ORAL
  Filled 2018-07-22 (×2): qty 1

## 2018-07-22 MED ORDER — LIDOCAINE 2% (20 MG/ML) 5 ML SYRINGE
INTRAMUSCULAR | Status: DC | PRN
  Administered 2018-07-22: 100 mg via INTRAVENOUS

## 2018-07-22 MED ORDER — OXYCODONE HCL 5 MG PO TABS
ORAL_TABLET | ORAL | Status: AC
  Filled 2018-07-22: qty 1

## 2018-07-22 MED ORDER — ROCURONIUM BROMIDE 50 MG/5ML IV SOSY
PREFILLED_SYRINGE | INTRAVENOUS | Status: DC | PRN
  Administered 2018-07-22 (×2): 50 mg via INTRAVENOUS

## 2018-07-22 MED ORDER — ATORVASTATIN CALCIUM 20 MG PO TABS
40.0000 mg | ORAL_TABLET | Freq: Every day | ORAL | Status: DC
  Administered 2018-07-22: 40 mg via ORAL
  Filled 2018-07-22: qty 2

## 2018-07-22 MED ORDER — MIRTAZAPINE 15 MG PO TABS
15.0000 mg | ORAL_TABLET | Freq: Every day | ORAL | Status: DC
  Administered 2018-07-22: 15 mg via ORAL
  Filled 2018-07-22: qty 1

## 2018-07-22 MED ORDER — DEXAMETHASONE SODIUM PHOSPHATE 10 MG/ML IJ SOLN
INTRAMUSCULAR | Status: DC | PRN
  Administered 2018-07-22: 10 mg via INTRAVENOUS

## 2018-07-22 MED ORDER — 0.9 % SODIUM CHLORIDE (POUR BTL) OPTIME
TOPICAL | Status: DC | PRN
  Administered 2018-07-22: 1000 mL

## 2018-07-22 MED ORDER — HYDROMORPHONE HCL 1 MG/ML IJ SOLN
INTRAMUSCULAR | Status: AC
  Filled 2018-07-22: qty 1

## 2018-07-22 MED ORDER — LACTATED RINGERS IV SOLN
INTRAVENOUS | Status: DC | PRN
  Administered 2018-07-22 (×2): via INTRAVENOUS

## 2018-07-22 MED ORDER — ACETAMINOPHEN 650 MG RE SUPP: 650.0000 mg | RECTAL | Status: DC | PRN

## 2018-07-22 MED ORDER — OXYCODONE-ACETAMINOPHEN 5-325 MG PO TABS: 2.0000 | ORAL_TABLET | Freq: Four times a day (QID) | ORAL | 0 refills | Status: AC | PRN

## 2018-07-22 MED ORDER — ONDANSETRON HCL 4 MG/2ML IJ SOLN
INTRAMUSCULAR | Status: DC | PRN
  Administered 2018-07-22: 4 mg via INTRAVENOUS

## 2018-07-22 MED ORDER — MEPERIDINE HCL 50 MG/ML IJ SOLN: 6.2500 mg | INTRAMUSCULAR | Status: DC | PRN

## 2018-07-22 MED ORDER — HYDROXYZINE HCL 25 MG PO TABS
50.0000 mg | ORAL_TABLET | Freq: Three times a day (TID) | ORAL | Status: DC
  Administered 2018-07-22 (×2): 50 mg via ORAL
  Filled 2018-07-22 (×2): qty 2

## 2018-07-22 MED ORDER — CHLORHEXIDINE GLUCONATE 4 % EX LIQD: 60.0000 mL | Freq: Once | CUTANEOUS | Status: DC

## 2018-07-22 MED ORDER — PROMETHAZINE HCL 25 MG/ML IJ SOLN: 6.2500 mg | INTRAMUSCULAR | Status: DC | PRN

## 2018-07-22 MED ORDER — DIVALPROEX SODIUM 500 MG PO DR TAB
1000.0000 mg | DELAYED_RELEASE_TABLET | Freq: Every day | ORAL | Status: DC
  Administered 2018-07-22: 1000 mg via ORAL
  Filled 2018-07-22: qty 2

## 2018-07-22 MED ORDER — HYDROMORPHONE HCL 1 MG/ML IJ SOLN
0.2500 mg | INTRAMUSCULAR | Status: DC | PRN
  Administered 2018-07-22: 0.5 mg via INTRAVENOUS

## 2018-07-22 MED ORDER — METHOCARBAMOL 500 MG PO TABS
500.0000 mg | ORAL_TABLET | Freq: Four times a day (QID) | ORAL | Status: DC | PRN
  Administered 2018-07-22 – 2018-07-23 (×3): 500 mg via ORAL
  Filled 2018-07-22 (×2): qty 1

## 2018-07-22 MED ORDER — DIVALPROEX SODIUM 500 MG PO DR TAB
500.0000 mg | DELAYED_RELEASE_TABLET | Freq: Every morning | ORAL | Status: DC
  Filled 2018-07-22: qty 1

## 2018-07-22 MED ORDER — TRAZODONE 25 MG HALF TABLET
25.0000 mg | ORAL_TABLET | ORAL | Status: DC
  Administered 2018-07-22: 25 mg via ORAL
  Filled 2018-07-22: qty 1

## 2018-07-22 MED ORDER — METHOCARBAMOL 1000 MG/10ML IJ SOLN
500.0000 mg | Freq: Four times a day (QID) | INTRAVENOUS | Status: DC | PRN
  Filled 2018-07-22: qty 5

## 2018-07-22 MED ORDER — FENTANYL CITRATE (PF) 250 MCG/5ML IJ SOLN
INTRAMUSCULAR | Status: AC
  Filled 2018-07-22: qty 5

## 2018-07-22 MED ORDER — CEFAZOLIN SODIUM-DEXTROSE 2-4 GM/100ML-% IV SOLN
2.0000 g | Freq: Three times a day (TID) | INTRAVENOUS | Status: AC
  Administered 2018-07-22 – 2018-07-23 (×2): 2 g via INTRAVENOUS
  Filled 2018-07-22 (×2): qty 100

## 2018-07-22 MED ORDER — ROCURONIUM BROMIDE 50 MG/5ML IV SOSY
PREFILLED_SYRINGE | INTRAVENOUS | Status: AC
  Filled 2018-07-22: qty 5

## 2018-07-22 MED ORDER — PHENYLEPHRINE 40 MCG/ML (10ML) SYRINGE FOR IV PUSH (FOR BLOOD PRESSURE SUPPORT)
PREFILLED_SYRINGE | INTRAVENOUS | Status: AC
  Filled 2018-07-22: qty 10

## 2018-07-22 MED ORDER — CLONIDINE HCL 0.1 MG PO TABS
0.1000 mg | ORAL_TABLET | Freq: Two times a day (BID) | ORAL | Status: DC
  Administered 2018-07-22: 0.1 mg via ORAL
  Filled 2018-07-22: qty 1

## 2018-07-22 MED ORDER — QUETIAPINE FUMARATE 100 MG PO TABS
100.0000 mg | ORAL_TABLET | Freq: Every day | ORAL | Status: DC
  Administered 2018-07-22: 100 mg via ORAL
  Filled 2018-07-22: qty 1

## 2018-07-22 MED ORDER — METHOCARBAMOL 500 MG PO TABS
ORAL_TABLET | ORAL | Status: AC
  Filled 2018-07-22: qty 1

## 2018-07-22 MED ORDER — OXYCODONE HCL 5 MG PO TABS
5.0000 mg | ORAL_TABLET | ORAL | Status: DC | PRN
  Administered 2018-07-22 – 2018-07-23 (×3): 5 mg via ORAL
  Filled 2018-07-22 (×2): qty 1

## 2018-07-22 MED ORDER — DEXAMETHASONE SODIUM PHOSPHATE 10 MG/ML IJ SOLN
INTRAMUSCULAR | Status: AC
  Filled 2018-07-22: qty 1

## 2018-07-22 MED ORDER — FENTANYL CITRATE (PF) 250 MCG/5ML IJ SOLN
INTRAMUSCULAR | Status: DC | PRN
  Administered 2018-07-22 (×2): 50 ug via INTRAVENOUS
  Administered 2018-07-22: 150 ug via INTRAVENOUS

## 2018-07-22 SURGICAL SUPPLY — 43 items
BENZOIN TINCTURE PRP APPL 2/3 (GAUZE/BANDAGES/DRESSINGS) ×3 IMPLANT
BUR ROUND FLUTED 4 SOFT TCH (BURR) ×2 IMPLANT
BUR ROUND FLUTED 4MM SOFT TCH (BURR) ×1
CANISTER SUCT 3000ML PPV (MISCELLANEOUS) ×3 IMPLANT
CLOSURE STERI-STRIP 1/2X4 (GAUZE/BANDAGES/DRESSINGS) ×1
CLSR STERI-STRIP ANTIMIC 1/2X4 (GAUZE/BANDAGES/DRESSINGS) ×2 IMPLANT
COVER SURGICAL LIGHT HANDLE (MISCELLANEOUS) ×3 IMPLANT
COVER WAND RF STERILE (DRAPES) ×3 IMPLANT
DECANTER SPIKE VIAL GLASS SM (MISCELLANEOUS) IMPLANT
DRAPE HALF SHEET 40X57 (DRAPES) IMPLANT
DRAPE MICROSCOPE LEICA (MISCELLANEOUS) ×3 IMPLANT
DRAPE SURG 17X23 STRL (DRAPES) ×3 IMPLANT
DRSG MEPILEX BORDER 4X4 (GAUZE/BANDAGES/DRESSINGS) ×3 IMPLANT
DURAPREP 26ML APPLICATOR (WOUND CARE) ×3 IMPLANT
ELECT REM PT RETURN 9FT ADLT (ELECTROSURGICAL) ×3
ELECTRODE REM PT RTRN 9FT ADLT (ELECTROSURGICAL) ×1 IMPLANT
GLOVE BIOGEL PI IND STRL 8 (GLOVE) ×2 IMPLANT
GLOVE BIOGEL PI INDICATOR 8 (GLOVE) ×4
GLOVE ORTHO TXT STRL SZ7.5 (GLOVE) ×6 IMPLANT
GOWN STRL REUS W/ TWL LRG LVL3 (GOWN DISPOSABLE) ×2 IMPLANT
GOWN STRL REUS W/ TWL XL LVL3 (GOWN DISPOSABLE) ×1 IMPLANT
GOWN STRL REUS W/TWL 2XL LVL3 (GOWN DISPOSABLE) ×3 IMPLANT
GOWN STRL REUS W/TWL LRG LVL3 (GOWN DISPOSABLE) ×4
GOWN STRL REUS W/TWL XL LVL3 (GOWN DISPOSABLE) ×2
KIT BASIN OR (CUSTOM PROCEDURE TRAY) ×3 IMPLANT
KIT TURNOVER KIT B (KITS) ×3 IMPLANT
MANIFOLD NEPTUNE II (INSTRUMENTS) ×3 IMPLANT
NEEDLE HYPO 25GX1X1/2 BEV (NEEDLE) ×3 IMPLANT
NEEDLE SPNL 18GX3.5 QUINCKE PK (NEEDLE) ×3 IMPLANT
NS IRRIG 1000ML POUR BTL (IV SOLUTION) ×3 IMPLANT
PACK LAMINECTOMY ORTHO (CUSTOM PROCEDURE TRAY) ×3 IMPLANT
PAD ARMBOARD 7.5X6 YLW CONV (MISCELLANEOUS) ×9 IMPLANT
PATTIES SURGICAL .5 X.5 (GAUZE/BANDAGES/DRESSINGS) IMPLANT
PATTIES SURGICAL .75X.75 (GAUZE/BANDAGES/DRESSINGS) IMPLANT
SUT VIC AB 0 CT1 27 (SUTURE)
SUT VIC AB 0 CT1 27XBRD ANBCTR (SUTURE) IMPLANT
SUT VIC AB 1 CTX 36 (SUTURE) ×2
SUT VIC AB 1 CTX36XBRD ANBCTR (SUTURE) ×1 IMPLANT
SUT VIC AB 2-0 CT1 27 (SUTURE) ×2
SUT VIC AB 2-0 CT1 TAPERPNT 27 (SUTURE) ×1 IMPLANT
SUT VIC AB 3-0 X1 27 (SUTURE) ×3 IMPLANT
TOWEL OR 17X24 6PK STRL BLUE (TOWEL DISPOSABLE) ×3 IMPLANT
TOWEL OR 17X26 10 PK STRL BLUE (TOWEL DISPOSABLE) ×3 IMPLANT

## 2018-07-22 NOTE — Op Note (Signed)
Preop diagnosis: Right L4-5 herniated nucleus pulposus with radiculopathy  Postop diagnosis: Same  Procedure: Right L4 hemilaminectomy, L4-5 right microdiscectomy and removal of cephalad migrated free fragment.  Surgeon: Annell Greening, MD  Assistant: Zonia Kief, PA-C medically necessary and present for the entire procedure  Anesthesia General plus Marcaine local  EBL: Minimal  Procedure after induction general anesthesia orotracheal intubation preoperative Ancef prophylaxis patient placed prone on chest rolls careful padding positioning arms at 9090 with yellow foam pads.  Roll pads underneath the arms.  Back was prepped with DuraPrep there is squared with towels Betadine Steri-Drape applied laminectomy sheets drapes and spinal needle placed at L4-5.  Timeout procedure was completed.  Crosstable lateral x-ray confirmed needle was overlying the fragment just above the L4-5 disc space.  Incision was made a couple millimeters to the right of midline subperiosteal dissection down through the thick layer of adipose tissue requiring the extra deep talar retractor placed lateral to the facet.  Nicholos Johns was placed at the inferior aspect the L4 lamina confirmed with a second x-ray and the bone was marked.  Lamina was thinned with the bur removed with a Kerrison rondure there are thick chunks of ligamentum which was removed.  Bone was removed at the level of pedicle and inspecting the disc space once some epidural veins were coagulated with the bipolar using the operative microscope for visualization.  There was an extruded fragment that had ruptured underneath the annulus and migrated cephalad right paracentral which was teased out with the ball-tipped nerve hook grasped with micropituitary's and removed.  Passes are made with the disc space with Epstein curettes up-and-down micropituitary is regular pituitary.  Continued decompression until all fragments were removed and the pouch where the fragment had migrated  with some scar tissue around it was removed completely.  Nerve root at L4 was free and lamina was removed until we were up at the axilla of the nerve root.  Nerve root below the disc space was well-visualized extend around the pedicle and was free.  Epidural space was dry bipolar been used extra long bipolar tips that were insulated.  Copious irrigation and then closure deep layer of fascia with #1 Vicryl 2-0 Vicryl subtenons tissue skin staple closure and postop dressing patient tolerated procedure well transfer the care of room stable condition.

## 2018-07-22 NOTE — Transfer of Care (Signed)
Immediate Anesthesia Transfer of Care Note  Patient: Donald Hess  Procedure(s) Performed: RIGHT L4-5 MICRODISCECTOMY,  RIGHT L4 HEMILAMINECTOMY (N/A )  Patient Location: PACU  Anesthesia Type:General  Level of Consciousness: awake, alert , drowsy and patient cooperative  Airway & Oxygen Therapy: Patient Spontanous Breathing and Patient connected to nasal cannula oxygen  Post-op Assessment: Report given to RN, Post -op Vital signs reviewed and stable and Patient moving all extremities X 4  Post vital signs: Reviewed and stable  Last Vitals:  Vitals Value Taken Time  BP    Temp    Pulse 95 07/22/2018  2:20 PM  Resp 3 07/22/2018  2:21 PM  SpO2 98 % 07/22/2018  2:20 PM  Vitals shown include unvalidated device data.  Last Pain:  Vitals:   07/22/18 1034  TempSrc:   PainSc: 7       Patients Stated Pain Goal: 7 (07/22/18 1034)  Complications: No apparent anesthesia complications

## 2018-07-22 NOTE — Anesthesia Postprocedure Evaluation (Signed)
Anesthesia Post Note  Patient: Edgerrin L Pandey  Procedure(s) Performed: RIGHT L4-5 MICRODISCECTOMY,  RIGHT L4 HEMILAMINECTOMY (N/A )     Patient location during evaluation: PACU Anesthesia Type: General Level of consciousness: awake and alert, oriented and patient cooperative Pain management: pain level controlled Vital Signs Assessment: post-procedure vital signs reviewed and stable Respiratory status: spontaneous breathing, nonlabored ventilation and respiratory function stable Cardiovascular status: blood pressure returned to baseline and stable Postop Assessment: no apparent nausea or vomiting Anesthetic complications: no    Last Vitals:  Vitals:   07/22/18 1420 07/22/18 1504  BP: 134/89 (!) 144/93  Pulse:  84  Resp: 16 20  Temp: 36.5 C 36.4 C  SpO2:  93%    Last Pain:  Vitals:   07/22/18 1545  TempSrc:   PainSc: 3                  Tyjai Charbonnet,E. Schuyler Olden

## 2018-07-22 NOTE — Anesthesia Preprocedure Evaluation (Addendum)
Anesthesia Evaluation  Patient identified by MRN, date of birth, ID band Patient awake    Reviewed: Allergy & Precautions, NPO status , Patient's Chart, lab work & pertinent test results  History of Anesthesia Complications Negative for: history of anesthetic complications  Airway Mallampati: I  TM Distance: >3 FB Neck ROM: Full    Dental  (+) Dental Advisory Given   Pulmonary sleep apnea (does not use CPAP) , Current Smoker,    breath sounds clear to auscultation       Cardiovascular hypertension, Pt. on medications (-) angina Rhythm:Regular Rate:Normal  07/04/18 stress test: EF is mildly decreased (45-54%), intermediate risk study due to reduced systolic function. There is no ischemia. 07/04/18 ECHO: EF 5-60%, valves OK   Neuro/Psych Anxiety Depression negative neurological ROS     GI/Hepatic Neg liver ROS, GERD  Medicated and Controlled,  Endo/Other  Morbid obesity  Renal/GU negative Renal ROS     Musculoskeletal  (+) Arthritis ,   Abdominal (+) + obese,   Peds  Hematology negative hematology ROS (+)   Anesthesia Other Findings   Reproductive/Obstetrics                            Anesthesia Physical Anesthesia Plan  ASA: II  Anesthesia Plan: General   Post-op Pain Management:    Induction: Intravenous  PONV Risk Score and Plan: 2 and Ondansetron and Dexamethasone  Airway Management Planned: Oral ETT  Additional Equipment:   Intra-op Plan:   Post-operative Plan: Extubation in OR  Informed Consent: I have reviewed the patients History and Physical, chart, labs and discussed the procedure including the risks, benefits and alternatives for the proposed anesthesia with the patient or authorized representative who has indicated his/her understanding and acceptance.   Dental advisory given  Plan Discussed with: CRNA and Surgeon  Anesthesia Plan Comments: (Plan routine  monitors, GETA)        Anesthesia Quick Evaluation

## 2018-07-22 NOTE — Anesthesia Procedure Notes (Signed)
Procedure Name: Intubation Date/Time: 07/22/2018 12:34 PM Performed by: Lily Peer, Summer, RN Pre-anesthesia Checklist: Patient identified, Emergency Drugs available, Suction available and Patient being monitored Patient Re-evaluated:Patient Re-evaluated prior to induction Oxygen Delivery Method: Circle system utilized Preoxygenation: Pre-oxygenation with 100% oxygen Induction Type: IV induction Ventilation: Two handed mask ventilation required and Oral airway inserted - appropriate to patient size Laryngoscope Size: Mac and 4 Grade View: Grade I Tube type: Oral Tube size: 7.5 mm Number of attempts: 1 Airway Equipment and Method: Stylet Placement Confirmation: ETT inserted through vocal cords under direct vision,  positive ETCO2,  CO2 detector and breath sounds checked- equal and bilateral Secured at: 23 cm Tube secured with: Tape Dental Injury: Teeth and Oropharynx as per pre-operative assessment

## 2018-07-22 NOTE — Interval H&P Note (Signed)
History and Physical Interval Note:  07/22/2018 12:13 PM  Donald Hess  has presented today for surgery, with the diagnosis of RIGHT L4-5 HERNIATED NUCLEUS PULPOSUS FREE FRAGMENTS  The various methods of treatment have been discussed with the patient and family. After consideration of risks, benefits and other options for treatment, the patient has consented to  Procedure(s): RIGHT L4-5 MICRODISCECTOMY,  RIGHT L4 HEMILAMINECTOMY (N/A) as a surgical intervention .  The patient's history has been reviewed, patient examined, no change in status, stable for surgery.  I have reviewed the patient's chart and labs.  Questions were answered to the patient's satisfaction.     Eldred Manges

## 2018-07-23 ENCOUNTER — Encounter (HOSPITAL_COMMUNITY): Payer: Self-pay | Admitting: Orthopaedic Surgery

## 2018-07-23 DIAGNOSIS — M5116 Intervertebral disc disorders with radiculopathy, lumbar region: Secondary | ICD-10-CM | POA: Diagnosis not present

## 2018-07-23 NOTE — Discharge Instructions (Signed)
Walk daily, avoid bending and lifting, OK to shower , leave dressing on. See Dr. Ophelia Charter in one week.

## 2018-07-23 NOTE — Progress Notes (Signed)
   Subjective: 1 Day Post-Op Procedure(s) (LRB): RIGHT L4-5 MICRODISCECTOMY,  RIGHT L4 HEMILAMINECTOMY (N/A) Patient reports pain as no leg pain. only incisional pain.    Objective: Vital signs in last 24 hours: Temp:  [97.6 F (36.4 C)-98.5 F (36.9 C)] 97.9 F (36.6 C) (10/29 0718) Pulse Rate:  [78-93] 83 (10/29 0718) Resp:  [16-22] 16 (10/29 0718) BP: (113-153)/(62-99) 113/72 (10/29 0718) SpO2:  [92 %-100 %] 92 % (10/29 0718) Weight:  [117.9 kg] 117.9 kg (10/28 1017)  Intake/Output from previous day: 10/28 0701 - 10/29 0700 In: 2380 [P.O.:1080; I.V.:1300] Out: 100 [Blood:100] Intake/Output this shift: No intake/output data recorded.  No results for input(s): HGB in the last 72 hours. No results for input(s): WBC, RBC, HCT, PLT in the last 72 hours. No results for input(s): NA, K, CL, CO2, BUN, CREATININE, GLUCOSE, CALCIUM in the last 72 hours. No results for input(s): LABPT, INR in the last 72 hours.  Neurologically intact Dg Lumbar Spine 2-3 Views  Result Date: 07/22/2018 CLINICAL DATA:  52 year old male. L4-5 micro discectomy. Initial encounter. EXAM: LUMBAR SPINE - 2-3 VIEW COMPARISON:  None. FINDINGS: Two intraoperative lateral views of the lumbar spine submitted for review after surgery. Level assignment with last fully open disc space assigned the L5-S1 level. Correlation with any outside exam recommended to confirm this level assignment. First film reveals metallic probe overlying the L4 spinous process. Second film reveals metallic probe posterior to the L4-5 disc space level. IMPRESSION: Localization L4-5 disc space level as detailed above. Electronically Signed   By: Lacy Duverney M.D.   On: 07/22/2018 14:46    Assessment/Plan: 1 Day Post-Op Procedure(s) (LRB): RIGHT L4-5 MICRODISCECTOMY,  RIGHT L4 HEMILAMINECTOMY (N/A) Plan: discharge home . Office one week.   Eldred Manges 07/23/2018, 7:36 AM

## 2018-07-23 NOTE — Progress Notes (Signed)
Pt doing well. Pt given D/C instructions with Rx's, verbal understanding was provided. Pt's incision is clean and dry with no sign of infection. Pt's IV was removed prior to D/C. Pt D/C'd home via wheelchair per MD order @ [phone]. Pt is stable @ D/C and has no other needs at this time. Rema Fendt, RN

## 2018-07-26 DIAGNOSIS — H5213 Myopia, bilateral: Secondary | ICD-10-CM | POA: Diagnosis not present

## 2018-07-26 NOTE — Discharge Summary (Signed)
Patient ID: Donald Hess MRN: [id number] DOB/AGE: 51/22/1967 51 y.o.  Admit date: 07/22/2018 Discharge date: 07/26/2018  Admission Diagnoses:  Active Problems:   Lumbar herniated disc   HNP (herniated nucleus pulposus), lumbar   Discharge Diagnoses:  Active Problems:   Lumbar herniated disc   HNP (herniated nucleus pulposus), lumbar  status post Procedure(s): RIGHT L4-5 MICRODISCECTOMY,  RIGHT L4 HEMILAMINECTOMY  Past Medical History:  Diagnosis Date  . Anxiety   . Arthritis   . Depression   . GERD (gastroesophageal reflux disease)   . Hypertension   . Pneumonia   . Sleep apnea    does not use cpap,  had tonsillectomy    Surgeries: Procedure(s): RIGHT L4-5 MICRODISCECTOMY,  RIGHT L4 HEMILAMINECTOMY on 07/22/2018   Consultants:   Discharged Condition: Improved  Hospital Course: Donald Hess is an 51 y.o. male who was admitted 07/22/2018 for operative treatment of lumbar stenosis. Patient failed conservative treatments (please see the history and physical for the specifics) and had severe unremitting pain that affects sleep, daily activities and work/hobbies. After pre-op clearance, the patient was taken to the operating room on 07/22/2018 and underwent  Procedure(s): RIGHT L4-5 MICRODISCECTOMY,  RIGHT L4 HEMILAMINECTOMY.    Patient was given perioperative antibiotics:  Anti-infectives (From admission, onward)   Start     Dose/Rate Route Frequency Ordered Stop   07/22/18 2030  ceFAZolin (ANCEF) IVPB 2g/100 mL premix     2 g 200 mL/hr over 30 Minutes Intravenous Every 8 hours 07/22/18 1501 07/23/18 0419   07/22/18 1015  ceFAZolin (ANCEF) 3 g in dextrose 5 % 50 mL IVPB     3 g 100 mL/hr over 30 Minutes Intravenous On call to O.R. 07/22/18 1013 07/22/18 1305       Patient was given sequential compression devices and early ambulation to prevent DVT.   Patient benefited maximally from hospital stay and there were no complications. At the time of  discharge, the patient was urinating/moving their bowels without difficulty, tolerating a regular diet, pain is controlled with oral pain medications and they have been cleared by PT/OT.   Recent vital signs: No data found.   Recent laboratory studies: No results for input(s): WBC, HGB, HCT, PLT, NA, K, CL, CO2, BUN, CREATININE, GLUCOSE, INR, CALCIUM in the last 72 hours.  Invalid input(s): PT, 2   Discharge Medications:   Allergies as of 07/23/2018      Reactions   Lisinopril Swelling   SWELLING REACTION UNSPECIFIED       Medication List    STOP taking these medications   traMADol 50 MG tablet Commonly known as:  ULTRAM     TAKE these medications   amLODipine 10 MG tablet Commonly known as:  NORVASC Take 1 tablet (10 mg total) by mouth daily.   atorvastatin 40 MG tablet Commonly known as:  LIPITOR Take 1 tablet (40 mg total) by mouth at bedtime.   BIOFREEZE EX Apply 1 application topically daily as needed (pain).   cloNIDine 0.1 MG tablet Commonly known as:  CATAPRES Take 1 tablet (0.1 mg total) by mouth 2 (two) times daily.   divalproex 500 MG DR tablet Commonly known as:  DEPAKOTE Take 500-1,000 mg by mouth See admin instructions. 1 tablet every morning and 2 tablets every night at bedtime   hydrochlorothiazide 25 MG tablet Commonly known as:  HYDRODIURIL Take 1 tablet (25 mg total) by mouth daily.   hydrOXYzine 50 MG tablet Commonly known as:  ATARAX/VISTARIL Take 50 mg  by mouth 3 (three) times daily.   ibuprofen 200 MG tablet Commonly known as:  ADVIL,MOTRIN Take 800 mg by mouth daily as needed for moderate pain.   methocarbamol 500 MG tablet Commonly known as:  ROBAXIN Take 1 tablet (500 mg total) by mouth every 6 (six) hours as needed for muscle spasms.   mirtazapine 15 MG tablet Commonly known as:  REMERON Take 15 mg by mouth at bedtime.   omeprazole 20 MG capsule Commonly known as:  PRILOSEC Take 1 capsule (20 mg total) by mouth daily.    oxyCODONE-acetaminophen 5-325 MG tablet Commonly known as:  PERCOCET/ROXICET Take 2 tablets by mouth every 6 (six) hours as needed for severe pain.   QUEtiapine 100 MG tablet Commonly known as:  SEROQUEL Take 100 mg by mouth at bedtime.   tamsulosin 0.4 MG Caps capsule Commonly known as:  FLOMAX Take 1 capsule (0.4 mg total) by mouth daily.   traZODone 50 MG tablet Commonly known as:  DESYREL Take 25-50 mg by mouth See admin instructions. Take 25 mg at 7pm and take 50 mg at bedtime       Diagnostic Studies: Dg Lumbar Spine 2-3 Views  Result Date: 07/22/2018 CLINICAL DATA:  52 year old male. L4-5 micro discectomy. Initial encounter. EXAM: LUMBAR SPINE - 2-3 VIEW COMPARISON:  None. FINDINGS: Two intraoperative lateral views of the lumbar spine submitted for review after surgery. Level assignment with last fully open disc space assigned the L5-S1 level. Correlation with any outside exam recommended to confirm this level assignment. First film reveals metallic probe overlying the L4 spinous process. Second film reveals metallic probe posterior to the L4-5 disc space level. IMPRESSION: Localization L4-5 disc space level as detailed above. Electronically Signed   By: Lacy Duverney M.D.   On: 07/22/2018 14:46      Follow-up Information    Eldred Manges, MD Follow up in 1 week(s).   Specialty:  Orthopedic Surgery Contact information: [address] [place] [address] [phone]           Discharge Plan:  discharge to home  Disposition:     Signed: Zonia Kief for mark yates md 07/26/2018, 11:17 AM

## 2018-07-30 ENCOUNTER — Encounter (INDEPENDENT_AMBULATORY_CARE_PROVIDER_SITE_OTHER): Payer: Self-pay | Admitting: Orthopaedic Surgery

## 2018-07-30 ENCOUNTER — Ambulatory Visit (INDEPENDENT_AMBULATORY_CARE_PROVIDER_SITE_OTHER): Payer: Medicare Other | Admitting: Orthopaedic Surgery

## 2018-07-30 VITALS — BP 142/90 | HR 114 | Ht 71.5 in | Wt 260.0 lb

## 2018-07-30 DIAGNOSIS — Z9889 Other specified postprocedural states: Secondary | ICD-10-CM

## 2018-07-30 MED ORDER — OXYCODONE-ACETAMINOPHEN 5-325 MG PO TABS: 1.0000 | ORAL_TABLET | Freq: Four times a day (QID) | ORAL | 0 refills | Status: AC | PRN

## 2018-07-30 NOTE — Progress Notes (Signed)
   Post-Op Visit Note   Patient: Donald Hess           Date of Birth: [birth year]           MRN: [id number] Visit Date: 07/30/2018 PCP: Doristine Bosworth, MD   Assessment & Plan: Post right L4-5 microdiscectomy with removal of migrated free fragment.  He is walking during the day states that night he has significant pain in his leg difficulty sleeping.  He has good strength anterior tib and EHL.  He is out of his pain medication prescription given for Percocet 35 tablets 5/325.  Recheck 4 weeks.  Steri-Strips removed.  Chief Complaint:  Chief Complaint  Patient presents with  . Lower Back - Routine Post Op   Visit Diagnoses:  1. Status post lumbar microdiscectomy     Plan: Recheck 4 weeks.  New Steri-Strips applied he can remove them in 1 week.  Incision looks good.  Follow-Up Instructions: Return in about 4 weeks (around 08/27/2018).   Orders:  No orders of the defined types were placed in this encounter.  Meds ordered this encounter  Medications  . oxyCODONE-acetaminophen (PERCOCET/ROXICET) 5-325 MG tablet    Sig: Take 1-2 tablets by mouth every 6 (six) hours as needed for severe pain.    Dispense:  35 tablet    Refill:  0    Imaging: No results found.  PMFS History: Patient Active Problem List   Diagnosis Date Noted  . HNP (herniated nucleus pulposus), lumbar 07/22/2018  . Essential hypertension 06/24/2018  . Mixed dyslipidemia 06/24/2018  . Cigarette smoker 06/24/2018  . Pre-operative cardiovascular examination 06/24/2018  . Sciatica, right side 06/18/2018  . Lumbar herniated disc 06/18/2018   Past Medical History:  Diagnosis Date  . Anxiety   . Arthritis   . Depression   . GERD (gastroesophageal reflux disease)   . Hypertension   . Pneumonia   . Sleep apnea    does not use cpap,  had tonsillectomy    History reviewed. No pertinent family history.  Past Surgical History:  Procedure Laterality Date  . LUMBAR LAMINECTOMY/DECOMPRESSION  MICRODISCECTOMY N/A 07/22/2018   Procedure: RIGHT L4-5 MICRODISCECTOMY,  RIGHT L4 HEMILAMINECTOMY;  Surgeon: Eldred Manges, MD;  Location: [facility] OR;  Service: Orthopedics;  Laterality: N/A;  . TONSILLECTOMY     Social History   Occupational History  . Not on file  Tobacco Use  . Smoking status: Current Every Day Smoker    Packs/day: 0.50    Years: 35.00    Pack years: 17.50    Types: Cigarettes  . Smokeless tobacco: Never Used  Substance and Sexual Activity  . Alcohol use: Yes    Comment: occasional  . Drug use: Yes    Types: Cocaine, Marijuana    Comment: "if i'm at a party"  . Sexual activity: Yes

## 2018-08-03 ENCOUNTER — Other Ambulatory Visit (INDEPENDENT_AMBULATORY_CARE_PROVIDER_SITE_OTHER): Payer: Self-pay | Admitting: Surgery

## 2018-08-05 NOTE — Telephone Encounter (Signed)
Ok for refill? 

## 2018-08-08 ENCOUNTER — Other Ambulatory Visit (INDEPENDENT_AMBULATORY_CARE_PROVIDER_SITE_OTHER): Payer: Self-pay | Admitting: Surgery

## 2018-08-08 NOTE — Telephone Encounter (Signed)
[facility] patient

## 2018-08-15 ENCOUNTER — Encounter: Payer: Self-pay | Admitting: Family Medicine

## 2018-10-16 DIAGNOSIS — H524 Presbyopia: Secondary | ICD-10-CM | POA: Diagnosis not present

## 2018-11-25 DIAGNOSIS — Z9114 Patient's other noncompliance with medication regimen: Secondary | ICD-10-CM | POA: Diagnosis not present

## 2018-11-25 DIAGNOSIS — K219 Gastro-esophageal reflux disease without esophagitis: Secondary | ICD-10-CM | POA: Diagnosis not present

## 2018-11-25 DIAGNOSIS — Z79899 Other long term (current) drug therapy: Secondary | ICD-10-CM | POA: Diagnosis not present

## 2018-11-25 DIAGNOSIS — H66001 Acute suppurative otitis media without spontaneous rupture of ear drum, right ear: Secondary | ICD-10-CM | POA: Diagnosis not present

## 2018-11-25 DIAGNOSIS — Z720 Tobacco use: Secondary | ICD-10-CM | POA: Diagnosis not present

## 2018-11-25 DIAGNOSIS — I10 Essential (primary) hypertension: Secondary | ICD-10-CM | POA: Diagnosis not present

## 2018-11-25 DIAGNOSIS — R112 Nausea with vomiting, unspecified: Secondary | ICD-10-CM | POA: Diagnosis not present

## 2018-11-26 DIAGNOSIS — R112 Nausea with vomiting, unspecified: Secondary | ICD-10-CM | POA: Diagnosis not present

## 2018-12-15 DIAGNOSIS — R079 Chest pain, unspecified: Secondary | ICD-10-CM | POA: Diagnosis not present

## 2018-12-15 DIAGNOSIS — J40 Bronchitis, not specified as acute or chronic: Secondary | ICD-10-CM | POA: Diagnosis not present

## 2018-12-15 DIAGNOSIS — Z0000 Encounter for general adult medical examination without abnormal findings: Secondary | ICD-10-CM | POA: Diagnosis not present

## 2019-01-09 DIAGNOSIS — E785 Hyperlipidemia, unspecified: Secondary | ICD-10-CM | POA: Diagnosis not present

## 2019-01-09 DIAGNOSIS — I10 Essential (primary) hypertension: Secondary | ICD-10-CM | POA: Diagnosis not present

## 2019-01-09 DIAGNOSIS — R7303 Prediabetes: Secondary | ICD-10-CM | POA: Diagnosis not present

## 2019-01-09 DIAGNOSIS — R918 Other nonspecific abnormal finding of lung field: Secondary | ICD-10-CM | POA: Diagnosis not present

## 2019-01-09 DIAGNOSIS — M549 Dorsalgia, unspecified: Secondary | ICD-10-CM | POA: Diagnosis not present

## 2019-01-17 DIAGNOSIS — R079 Chest pain, unspecified: Secondary | ICD-10-CM | POA: Diagnosis not present

## 2019-01-17 DIAGNOSIS — R918 Other nonspecific abnormal finding of lung field: Secondary | ICD-10-CM | POA: Diagnosis not present

## 2019-01-27 DIAGNOSIS — R0789 Other chest pain: Secondary | ICD-10-CM | POA: Diagnosis not present

## 2019-01-27 DIAGNOSIS — R079 Chest pain, unspecified: Secondary | ICD-10-CM | POA: Diagnosis not present

## 2019-01-27 DIAGNOSIS — M549 Dorsalgia, unspecified: Secondary | ICD-10-CM | POA: Diagnosis not present

## 2019-01-27 DIAGNOSIS — E785 Hyperlipidemia, unspecified: Secondary | ICD-10-CM | POA: Diagnosis not present

## 2019-01-27 DIAGNOSIS — Z638 Other specified problems related to primary support group: Secondary | ICD-10-CM | POA: Diagnosis not present

## 2019-01-27 DIAGNOSIS — Z791 Long term (current) use of non-steroidal anti-inflammatories (NSAID): Secondary | ICD-10-CM | POA: Diagnosis not present

## 2019-01-27 DIAGNOSIS — Z590 Homelessness: Secondary | ICD-10-CM | POA: Diagnosis not present

## 2019-01-27 DIAGNOSIS — E876 Hypokalemia: Secondary | ICD-10-CM | POA: Diagnosis not present

## 2019-01-27 DIAGNOSIS — Z888 Allergy status to other drugs, medicaments and biological substances status: Secondary | ICD-10-CM | POA: Diagnosis not present

## 2019-01-27 DIAGNOSIS — I10 Essential (primary) hypertension: Secondary | ICD-10-CM | POA: Diagnosis not present

## 2019-01-27 DIAGNOSIS — R740 Nonspecific elevation of levels of transaminase and lactic acid dehydrogenase [LDH]: Secondary | ICD-10-CM | POA: Diagnosis not present

## 2019-01-27 DIAGNOSIS — G8929 Other chronic pain: Secondary | ICD-10-CM | POA: Diagnosis not present

## 2019-01-27 DIAGNOSIS — K219 Gastro-esophageal reflux disease without esophagitis: Secondary | ICD-10-CM | POA: Diagnosis not present

## 2020-04-16 IMAGING — CR DG HIP (WITH OR WITHOUT PELVIS) 2-3V*R*
3 series · 3 of 3 positions shown · non-contrast
Comparison: No prior.

CLINICAL DATA: Pain right hip and leg.

EXAM:
DG HIP (WITH OR WITHOUT PELVIS) 2-3V RIGHT

[t pelvis ap]
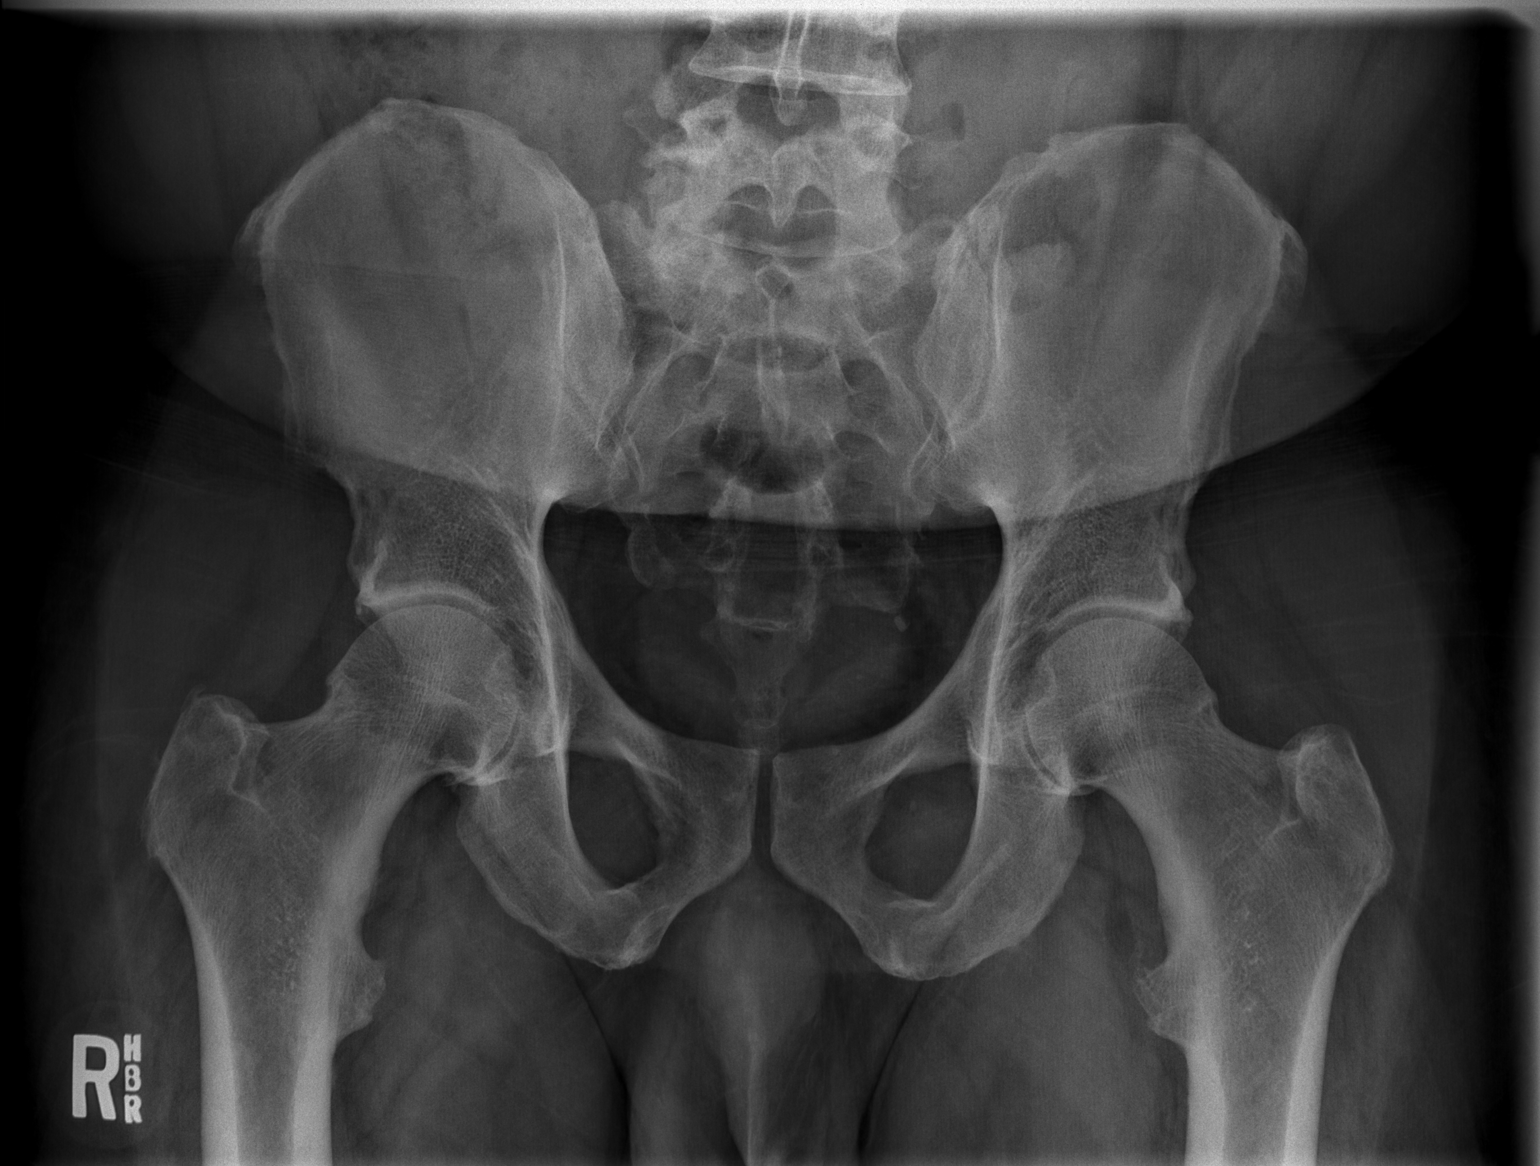

[t hip ap right]
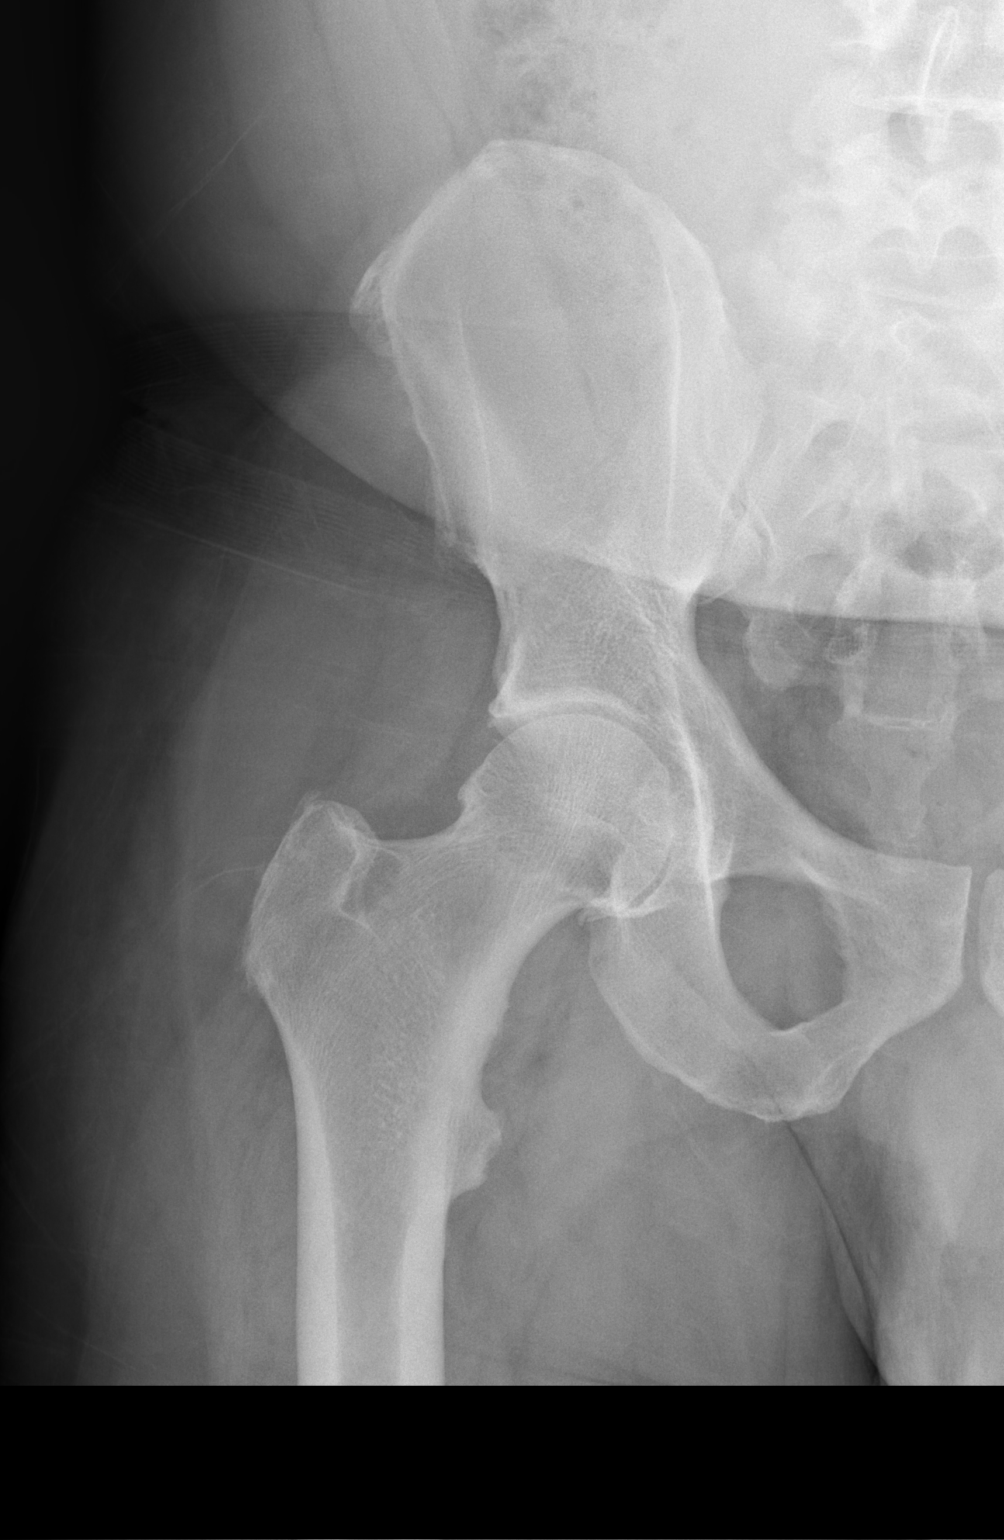

[t hip frog leg right]
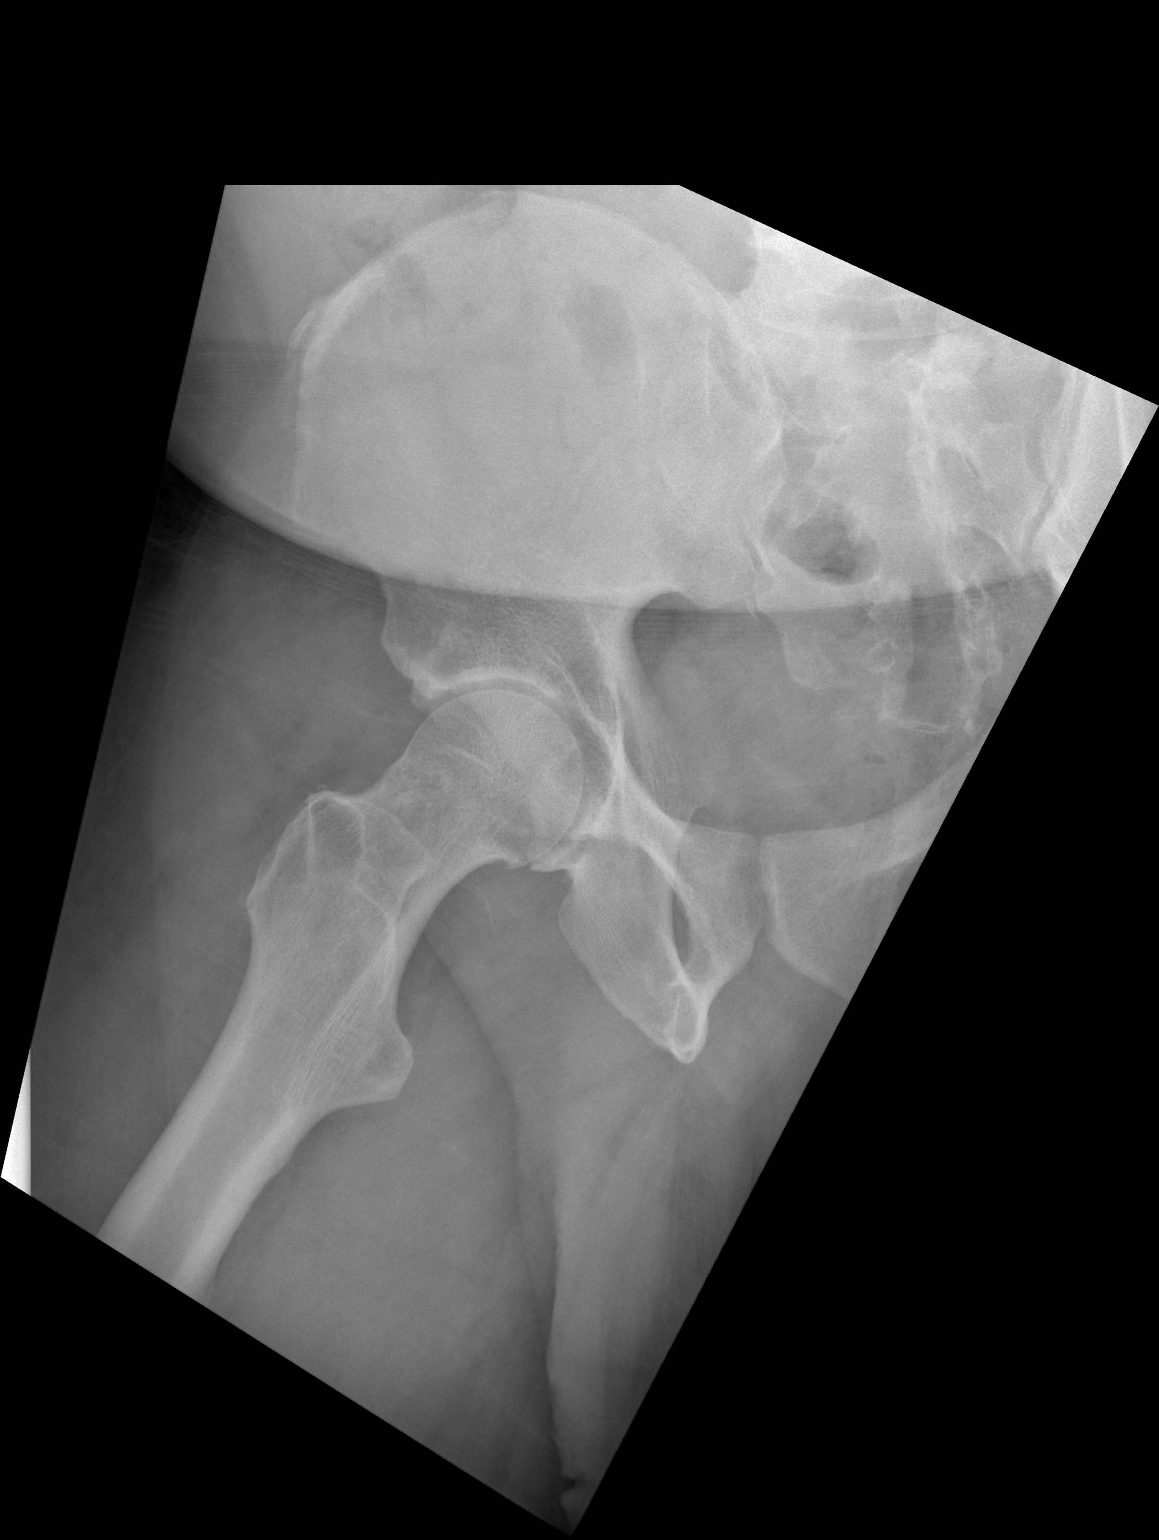

[3 of 3 positions shown; findings below may reference images not displayed]

FINDINGS: Degenerative changes lumbar spine and both hips. No acute bony or
joint abnormality identified. No evidence of fracture dislocation.
Pelvic calcifications consistent with phleboliths.
IMPRESSION: Degenerative changes lumbar spine and both hips. No acute
abnormality identified.

## 2020-05-13 IMAGING — MR MR LUMBAR SPINE W/O CM
4 of 5 series · 18 of 48 positions shown · non-contrast
Comparison: Radiography 06/04/2018

CLINICAL DATA: Chronic low back pain with numbness in weakness in
the right leg, several years duration.

EXAM:
MRI LUMBAR SPINE WITHOUT CONTRAST
TECHNIQUE: Multiplanar, multisequence MR imaging of the lumbar spine was
performed. No intravenous contrast was administered.

[Series 6: T2 · sagittal · 4.0mm · 0.73mm/px · 6 of 15 slices shown (1 of 2)]
[im 1/15]
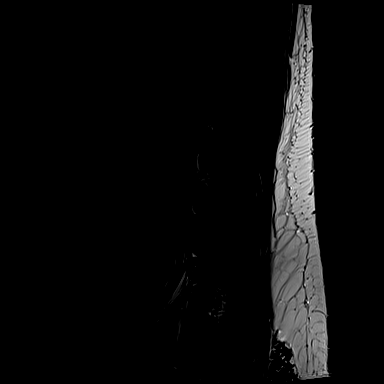
[im 3/15]
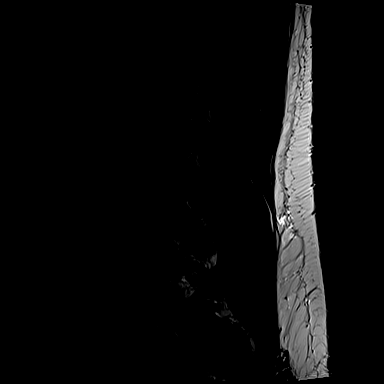
[im 6/15]
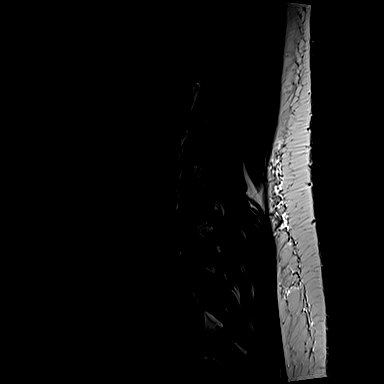
[im 9/15]
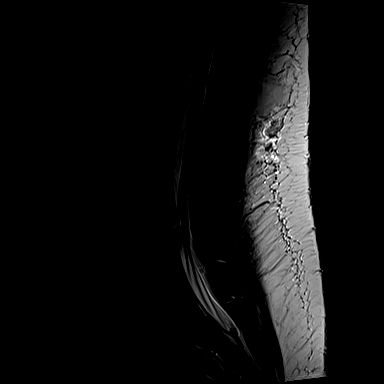
[im 12/15]
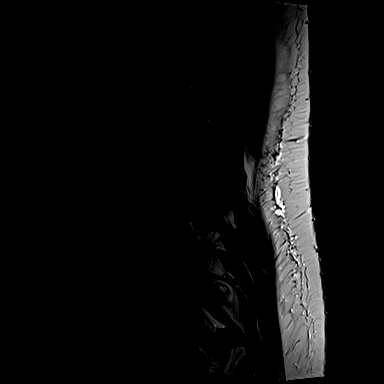
[im 15/15]
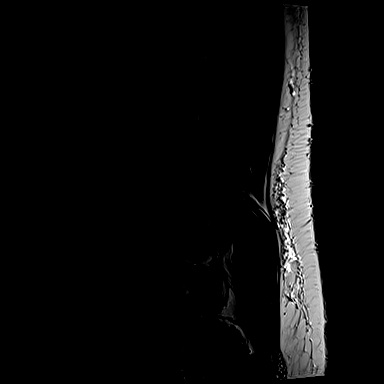

[Series 7: T1 · sagittal · 4.0mm · 0.73mm/px · 3 of 15 slices shown (1 of 2)]
[im 1/15]
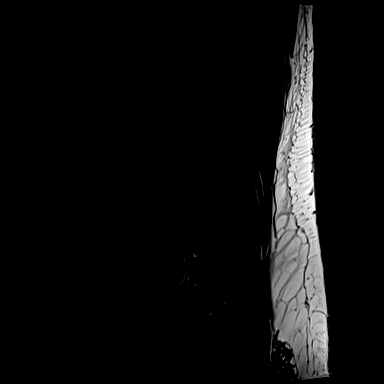
[im 8/15]
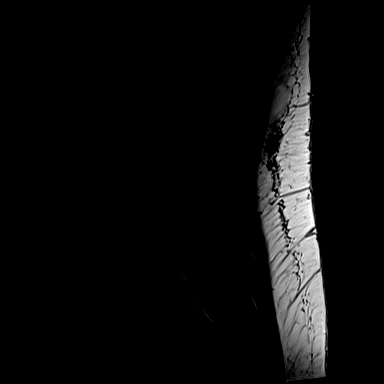
[im 15/15]
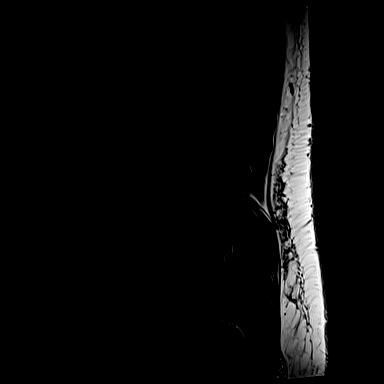

[Series 11: T1 · axial · 4.0mm · 0.28mm/px · z∈[-31,+150]mm · 3 of 45 slices shown (2 of 2)]
[im 6/45]
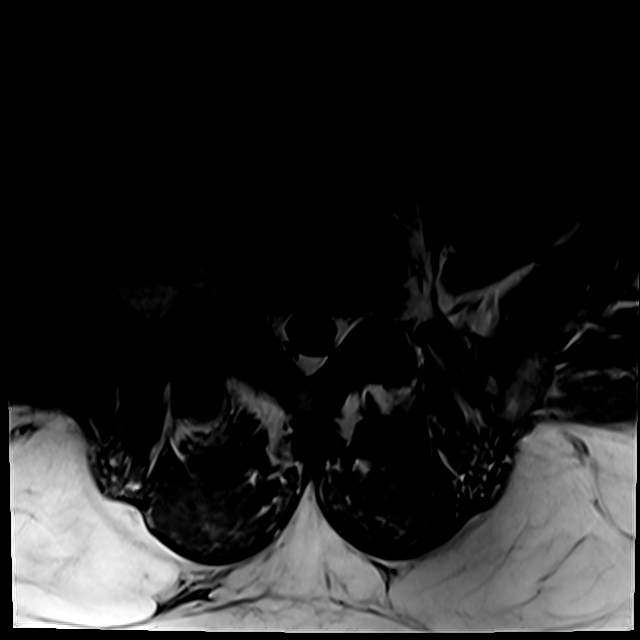
[im 24/45]
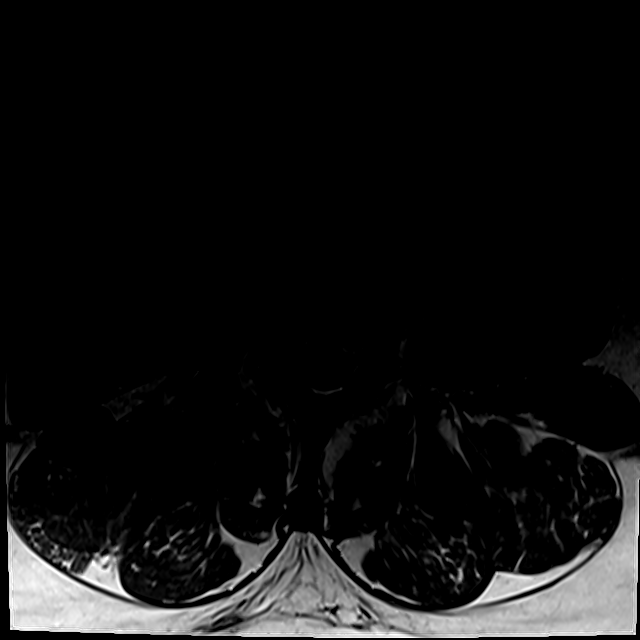
[im 39/45]
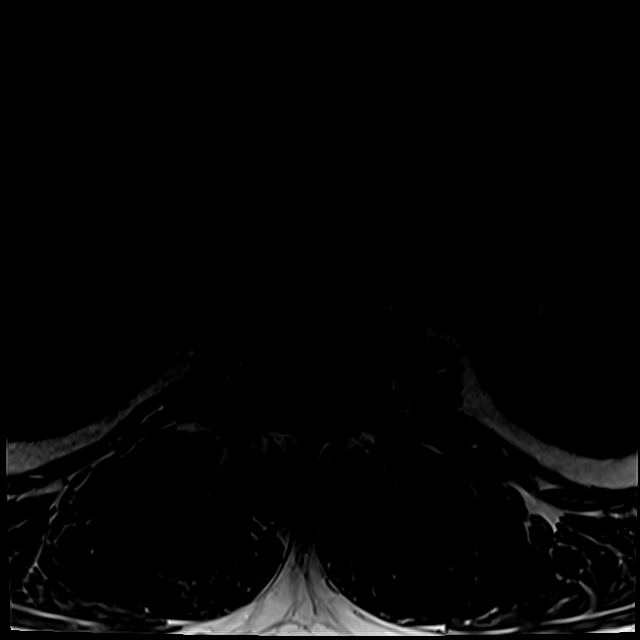

[Series 15: T2 · axial · 4.0mm · 0.28mm/px · z∈[-46,+150]mm · 6 of 45 slices shown (2 of 2)]
[im 3/45]
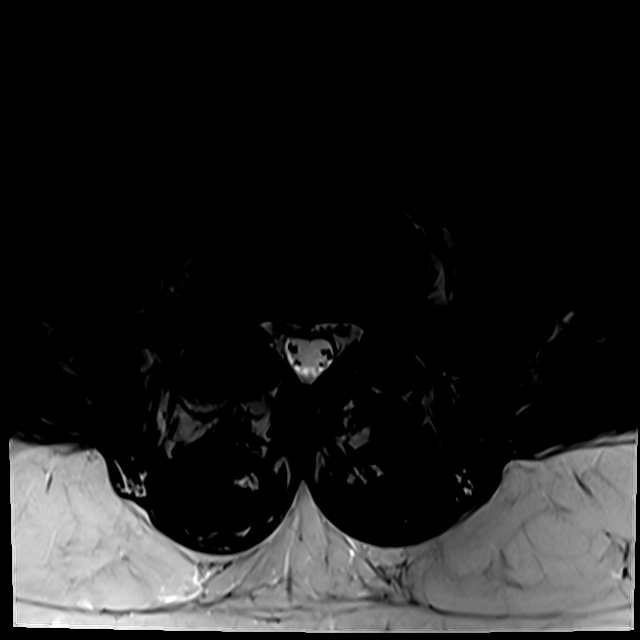
[im 6/45]
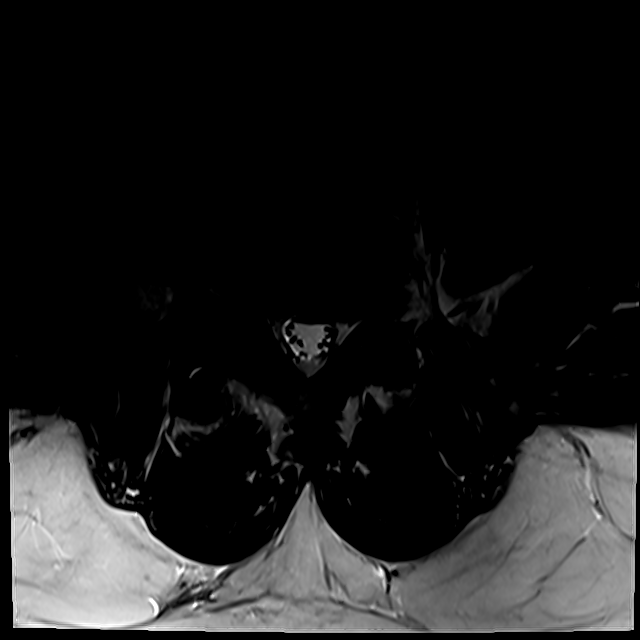
[im 9/45]
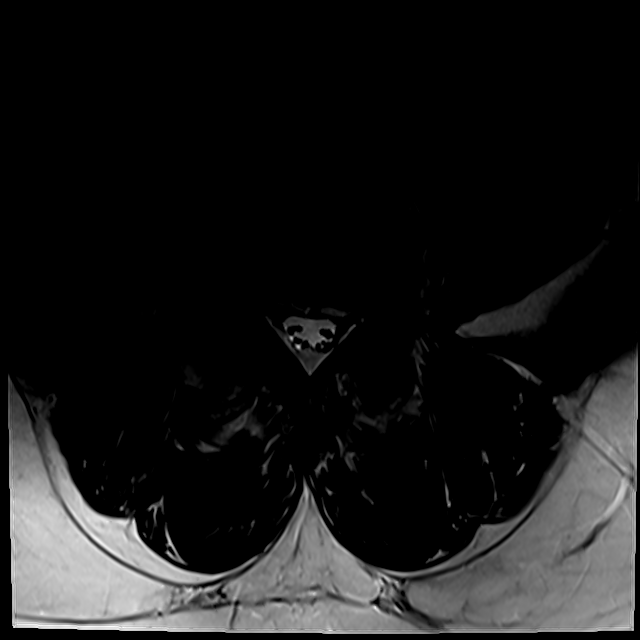
[im 15/45]
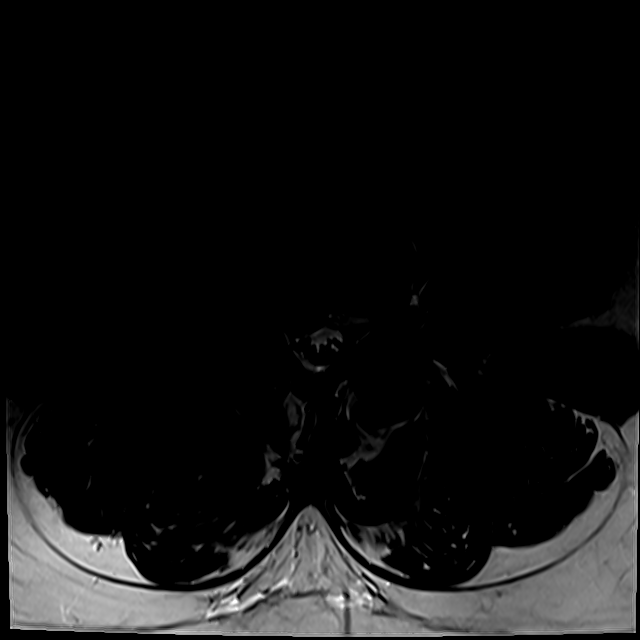
[im 24/45]
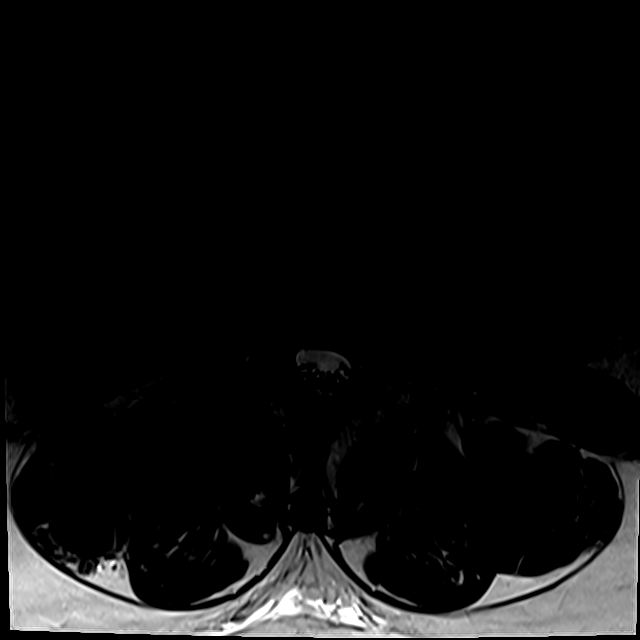
[im 39/45]
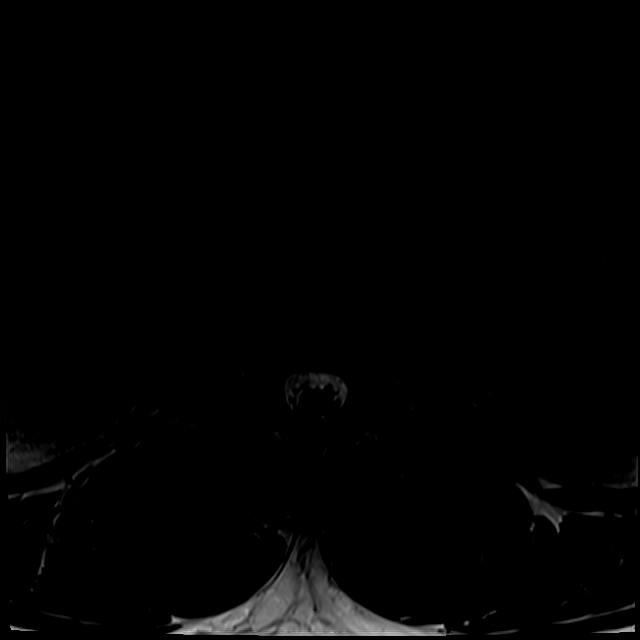

[18 of 48 positions shown; findings below may reference images not displayed]

FINDINGS: Segmentation:  5 lumbar type vertebral bodies.

Alignment:  Normal

Vertebrae:  Normal

Conus medullaris and cauda equina: Conus extends to the L1-2 level.
Conus and cauda equina appear normal.

Paraspinal and other soft tissues: Normal

Disc levels:

Normal at L2-3 and above.

L3-4: Mild bulging of the disc.  No canal or foraminal stenosis.

L4-5: Disc degeneration with right posterolateral disc herniation
with a 1 cm disc fragment migrated upward in the right lateral
recess behind L4. This would likely compress the right L4 nerve.
There is bilateral facet osteoarthritis at this level which could
contribute to back pain.

L5-S1: Normal appearance of the disc. Facet osteoarthritis right
worse than left that could be a cause of low back pain.
IMPRESSION: L4-5: Right posterolateral disc herniation with a 1 cm disc fragment
migrated upward the right of midline behind L4, likely to compress
the right L4 nerve.

L3-4 disc bulge without compressive stenosis.

Edematous facet arthritis bilaterally at L4-5 and on the right at
L5-S1 that could contribute back pain.
